# Patient Record
Sex: Male | Born: 1970 | Race: White | Hispanic: No | Marital: Married | Smoking: Never smoker
Health system: Southern US, Community
[De-identification: ages and names within clinical notes are randomized; demographics above are authoritative.]

## PROBLEM LIST (undated history)

## (undated) DIAGNOSIS — K635 Polyp of colon: Secondary | ICD-10-CM

## (undated) HISTORY — PX: NASAL SEPTUM SURGERY: SHX37

## (undated) HISTORY — PX: NASAL SINUS SURGERY: SHX719

## (undated) HISTORY — DX: Polyp of colon: K63.5

---

## 2017-01-03 DIAGNOSIS — D2262 Melanocytic nevi of left upper limb, including shoulder: Secondary | ICD-10-CM | POA: Diagnosis not present

## 2017-01-03 DIAGNOSIS — D225 Melanocytic nevi of trunk: Secondary | ICD-10-CM | POA: Diagnosis not present

## 2017-01-03 DIAGNOSIS — D2271 Melanocytic nevi of right lower limb, including hip: Secondary | ICD-10-CM | POA: Diagnosis not present

## 2017-01-03 DIAGNOSIS — L821 Other seborrheic keratosis: Secondary | ICD-10-CM | POA: Diagnosis not present

## 2017-02-17 ENCOUNTER — Ambulatory Visit: Payer: BLUE CROSS/BLUE SHIELD | Admitting: Podiatry

## 2017-02-17 ENCOUNTER — Encounter: Payer: Self-pay | Admitting: Podiatry

## 2017-02-17 ENCOUNTER — Ambulatory Visit (INDEPENDENT_AMBULATORY_CARE_PROVIDER_SITE_OTHER): Payer: BLUE CROSS/BLUE SHIELD

## 2017-02-17 DIAGNOSIS — M779 Enthesopathy, unspecified: Secondary | ICD-10-CM

## 2017-02-17 MED ORDER — MELOXICAM 15 MG PO TABS: 15.0000 mg | ORAL_TABLET | Freq: Every day | ORAL | 2 refills | Status: DC

## 2017-02-17 NOTE — Patient Instructions (Signed)
Plantar Fasciitis Rehab Ask your health care provider which exercises are safe for you. Do exercises exactly as told by your health care provider and adjust them as directed. It is normal to feel mild stretching, pulling, tightness, or discomfort as you do these exercises, but you should stop right away if you feel sudden pain or your pain gets worse. Do not begin these exercises until told by your health care provider. Stretching and range of motion exercises These exercises warm up your muscles and joints and improve the movement and flexibility of your foot. These exercises also help to relieve pain. Exercise A: Plantar fascia stretch  1. Sit with your left / right leg crossed over your opposite knee. 2. Hold your heel with one hand with that thumb near your arch. With your other hand, hold your toes and gently pull them back toward the top of your foot. You should feel a stretch on the bottom of your toes or your foot or both. 3. Hold this stretch for__________ seconds. 4. Slowly release your toes and return to the starting position. Repeat __________ times. Complete this exercise __________ times a day. Exercise B: Gastroc, standing  1. Stand with your hands against a wall. 2. Extend your left / right leg behind you, and bend your front knee slightly. 3. Keeping your heels on the floor and keeping your back knee straight, shift your weight toward the wall without arching your back. You should feel a gentle stretch in your left / right calf. 4. Hold this position for __________ seconds. Repeat __________ times. Complete this exercise __________ times a day. Exercise C: Soleus, standing 1. Stand with your hands against a wall. 2. Extend your left / right leg behind you, and bend your front knee slightly. 3. Keeping your heels on the floor, bend your back knee and slightly shift your weight over the back leg. You should feel a gentle stretch deep in your calf. 4. Hold this position for  __________ seconds. Repeat __________ times. Complete this exercise __________ times a day. Exercise D: Gastrocsoleus, standing 1. Stand with the ball of your left / right foot on a step. The ball of your foot is on the walking surface, right under your toes. 2. Keep your other foot firmly on the same step. 3. Hold onto the wall or a railing for balance. 4. Slowly lift your other foot, allowing your body weight to press your heel down over the edge of the step. You should feel a stretch in your left / right calf. 5. Hold this position for __________ seconds. 6. Return both feet to the step. 7. Repeat this exercise with a slight bend in your left / right knee. Repeat __________ times with your left / right knee straight and __________ times with your left / right knee bent. Complete this exercise __________ times a day. Balance exercise This exercise builds your balance and strength control of your arch to help take pressure off your plantar fascia. Exercise E: Single leg stand 1. Without shoes, stand near a railing or in a doorway. You may hold onto the railing or door frame as needed. 2. Stand on your left / right foot. Keep your big toe down on the floor and try to keep your arch lifted. Do not let your foot roll inward. 3. Hold this position for __________ seconds. 4. If this exercise is too easy, you can try it with your eyes closed or while standing on a pillow. Repeat __________ times. Complete  this exercise __________ times a day. This information is not intended to replace advice given to you by your health care provider. Make sure you discuss any questions you have with your health care provider. Document Released: 01/10/2005 Document Revised: 09/15/2015 Document Reviewed: 11/24/2014 Elsevier Interactive Patient Education  2018 [employer]   Peroneal Tendinopathy Rehab Ask your health care provider which exercises are safe for you. Do exercises exactly as told by your health care  provider and adjust them as directed. It is normal to feel mild stretching, pulling, tightness, or discomfort as you do these exercises, but you should stop right away if you feel sudden pain or your pain gets worse.Do not begin these exercises until told by your health care provider. Stretching and range of motion exercises These exercises warm up your muscles and joints and improve the movement and flexibility of your ankle. These exercises also help to relieve pain and stiffness. Exercise A: Gastroc and soleus, standing 1. Stand on the edge of a step on the balls of your feet. The ball of your foot is on the walking surface, right under your toes. 2. Hold onto the railing for balance. 3. Slowly lift your left / right foot, allowing your body weight to press your left / right heel down over the edge of the step. You should feel a stretch in your left / right calf. 4. Hold this position for __________ seconds. Repeat __________ times with your left / right knee straight and __________ times with your left / right knee bent. Complete this stretch __________ times per day. Strengthening exercises These exercises improve the strength and endurance of your foot and ankle. Endurance is the ability to use your muscles for a long time, even after they get tired. Exercise B: Dorsiflexors  1. Secure a rubber exercise band or tube to an object, like a table leg, that will not move if it is pulled on. 2. Secure the other end of the band around your left / right foot. 3. Sit on the floor, facing the object with your left / right foot extended. The band or tube should be slightly tense when your foot is relaxed. 4. Slowly flex your left / right ankle and toes to bring your foot toward you. 5. Hold this position for __________ seconds. 6. Slowly return your foot to the starting position. Repeat __________ times. Complete this exercise __________ times per day. Exercise C: Evertors 1. Sit on the floor with  your legs straight out in front of you. 2. Loop a rubber exercise or band or tube around the ball of your left / right foot. The ball of your foot is on the walking surface, right under your toes. 3. Hold the ends of the band in your hands, or secure the band to a stable object. 4. Slowly push your foot outward, away from your other leg. 5. Hold this position for __________ seconds. 6. Slowly return your foot to the starting position. Repeat __________ times. Complete this exercise __________ times per day. Exercise D: Standing heel raise ( plantar flexion) 1. Stand with your feet shoulder-width apart with the balls of your feet on a step. The ball of your foot is on the walking surface, right under your toes. 2. Keep your weight spread evenly over the width of your feet while you rise up on your toes. Use a wall or railing to steady yourself, but try not to use it for support. 3. If this exercise is too easy, try   these options: ? Shift your weight toward your left / right leg until you feel challenged. ? If told by your health care provider, stand on your left / right leg only. 4. Hold this position for __________ seconds. Repeat __________ times. Complete this exercise __________ times per day. Exercise E: Single leg stand 1. Without shoes, stand near a railing or in a doorway. You may hold onto the railing or door frame as needed. 2. Stand on your left / right foot. Keep your big toe down on the floor and try to keep your arch lifted. ? Do not roll to the outside of your foot. ? If this exercise is too easy, you can try it with your eyes closed or while standing on a pillow. 3. Hold this position for __________ seconds. Repeat __________ times. Complete this exercise __________ times per day. This information is not intended to replace advice given to you by your health care provider. Make sure you discuss any questions you have with your health care provider. Document Released: 01/10/2005  Document Revised: 09/17/2015 Document Reviewed: 11/29/2014 Elsevier Interactive Patient Education  [employer]2018 [employer] Inc.

## 2017-02-20 NOTE — Progress Notes (Signed)
Subjective:   Patient ID: Jesus Jimenez, male   DOB: 47 y.o.   MRN: [id number]   HPI Jesus Jimenez presents the office today for concerns of right foot pain.  He states that he runs 5K's on the weekends and also he has been doing some longer races.  Last weekend he started noticing increased sharp pain to his right foot which was new and he points on the heel as well as the outside aspect of the foot.  He states that the area was painful for couple days and has resolved he has no pain today.  He did have some mild swelling last week and that the swelling is also improved.  Denies any redness or warmth.  No specific injury that he can recall.  He does have a history of fracture several years ago.  He has no other concerns.  Last weekend when the pain started he did run in an older pair of shoes.  Review of Systems  All other systems reviewed and are negative.  No past medical history on file.     Current Outpatient Medications:  .  meloxicam (MOBIC) 15 MG tablet, Take 1 tablet (15 mg total) by mouth daily., Disp: 30 tablet, Rfl: 2  Not on File  Social History   Socioeconomic History  . Marital status: Married    Spouse name: Not on file  . Number of children: Not on file  . Years of education: Not on file  . Highest education level: Not on file  Social Needs  . Financial resource strain: Not on file  . Food insecurity - worry: Not on file  . Food insecurity - inability: Not on file  . Transportation needs - medical: Not on file  . Transportation needs - non-medical: Not on file  Occupational History  . Not on file  Tobacco Use  . Smoking status: Never Smoker  . Smokeless tobacco: Never Used  Substance and Sexual Activity  . Alcohol use: Not on file  . Drug use: Not on file  . Sexual activity: Not on file  Other Topics Concern  . Not on file  Social History Narrative  . Not on file         Objective:  Physical Exam  General: AAO x3, NAD  Dermatological: Skin is  warm, dry and supple bilateral. Nails x 10 are well manicured; remaining integument appears unremarkable at this time. There are no open sores, no preulcerative lesions, no rash or signs of infection present.  Vascular: Dorsalis Pedis artery and Posterior Tibial artery pedal pulses are 2/4 bilateral with immedate capillary fill time.  There is no pain with calf compression, swelling, warmth, erythema.   Neruologic: Grossly intact via light touch bilateral.  Protective threshold with Semmes Wienstein monofilament intact to all pedal sites bilateral.   Musculoskeletal: There is a dorsal prominence of the second metatarsal.  There is no area pinpoint bony tenderness identified today there is no area of pain with vibratory sensation.  There is no edema.  There is subjective tenderness along the lateral aspect of the foot on the course of the peroneal tendon along the insertion of the fifth metatarsal base but there is no pain to the area today.  Also he was having some pain in the bottom of his heel and this was on the plantar aspect of the heel but again there is no pain today.  Plantar fascia appears to be intact and Achilles tendon appears to be intact.  Overall rectus  foot type with weightbearing.  Muscular strength 5/5 in all groups tested bilateral.  Gait: Unassisted, Nonantalgic.      Assessment:  Right foot tendinitis stress fracture unlikely given no pain today    Plan:  -Treatment options discussed including all alternatives, risks, and complications -Etiology of symptoms were discussed -X-rays were obtained and reviewed with the patient.  No definitive evidence of acute fracture.  Old fracture of the second metatarsal.  No evidence of stress fracture today.  Small calcaneal spurring is present. -At this time there is no pain on exam.  Because of this I do not think that he has a stress fracture.  This is more a result of tendinitis.  We discussed stretching, rehab exercises.  Also prescribed  meloxicam to take as needed.  Ice to the area.  Recommended him to not wear the shoes that he wore last weekend as they were old.  If he gets recurrent symptoms we will need to get a new x-ray.  Also discussed possibly inserts in his shoes.  Also discussed physical therapy.  Jesus Jimenez DPM

## 2017-05-16 DIAGNOSIS — L821 Other seborrheic keratosis: Secondary | ICD-10-CM | POA: Diagnosis not present

## 2017-05-16 DIAGNOSIS — S40861A Insect bite (nonvenomous) of right upper arm, initial encounter: Secondary | ICD-10-CM | POA: Diagnosis not present

## 2017-05-16 DIAGNOSIS — C44612 Basal cell carcinoma of skin of right upper limb, including shoulder: Secondary | ICD-10-CM | POA: Diagnosis not present

## 2017-05-23 ENCOUNTER — Ambulatory Visit (INDEPENDENT_AMBULATORY_CARE_PROVIDER_SITE_OTHER): Payer: BLUE CROSS/BLUE SHIELD | Admitting: Family Medicine

## 2017-05-23 ENCOUNTER — Encounter: Payer: Self-pay | Admitting: Family Medicine

## 2017-05-23 VITALS — BP 122/80 | HR 81 | Temp 97.7°F | Resp 14 | Ht 71.0 in | Wt 177.0 lb

## 2017-05-23 DIAGNOSIS — K635 Polyp of colon: Secondary | ICD-10-CM | POA: Insufficient documentation

## 2017-05-23 DIAGNOSIS — Z13220 Encounter for screening for lipoid disorders: Secondary | ICD-10-CM

## 2017-05-23 DIAGNOSIS — Z114 Encounter for screening for human immunodeficiency virus [HIV]: Secondary | ICD-10-CM

## 2017-05-23 DIAGNOSIS — G4730 Sleep apnea, unspecified: Secondary | ICD-10-CM | POA: Diagnosis not present

## 2017-05-23 DIAGNOSIS — Z0001 Encounter for general adult medical examination with abnormal findings: Secondary | ICD-10-CM

## 2017-05-23 DIAGNOSIS — R1900 Intra-abdominal and pelvic swelling, mass and lump, unspecified site: Secondary | ICD-10-CM | POA: Insufficient documentation

## 2017-05-23 LAB — CBC
HCT: 44.6 % (ref 39.0–52.0)
Hemoglobin: 15.2 g/dL (ref 13.0–17.0)
MCHC: 34.1 g/dL (ref 30.0–36.0)
MCV: 92.4 fl (ref 78.0–100.0)
PLATELETS: 349 10*3/uL (ref 150.0–400.0)
RBC: 4.83 Mil/uL (ref 4.22–5.81)
RDW: 13.1 % (ref 11.5–15.5)
WBC: 5.1 10*3/uL (ref 4.0–10.5)

## 2017-05-23 LAB — LIPID PANEL
Cholesterol: 144 mg/dL (ref 0–200)
HDL: 44.4 mg/dL (ref >39.00)
LDL CALC: 88 mg/dL (ref 0–99)
NONHDL: 99.3
Total CHOL/HDL Ratio: 3
Triglycerides: 57 mg/dL (ref 0.0–149.0)
VLDL: 11.4 mg/dL (ref 0.0–40.0)

## 2017-05-23 LAB — COMPREHENSIVE METABOLIC PANEL
ALBUMIN: 4.5 g/dL (ref 3.5–5.2)
ALT: 24 U/L (ref 0–53)
AST: 17 U/L (ref 0–37)
Alkaline Phosphatase: 46 U/L (ref 39–117)
BUN: 11 mg/dL (ref 6–23)
CHLORIDE: 106 meq/L (ref 96–112)
CO2: 27 mEq/L (ref 19–32)
CREATININE: 0.93 mg/dL (ref 0.40–1.50)
Calcium: 9.6 mg/dL (ref 8.4–10.5)
GFR: 92.93 mL/min (ref >60.00)
Glucose, Bld: 83 mg/dL (ref 70–99)
Potassium: 4.5 mEq/L (ref 3.5–5.1)
Sodium: 142 mEq/L (ref 135–145)
Total Bilirubin: 0.7 mg/dL (ref 0.2–1.2)
Total Protein: 7.2 g/dL (ref 6.0–8.3)

## 2017-05-23 LAB — TSH: TSH: 1.96 u[IU]/mL (ref 0.35–4.50)

## 2017-05-23 NOTE — Assessment & Plan Note (Signed)
With occasional dozing off during the day.  Will place referral for sleep study.  Also check CBC, CMET, and TSH.

## 2017-05-23 NOTE — Patient Instructions (Signed)
It would be a good idea for you to get a sleep study. We can set this up if you are interested.  I think the spot on your abdomen is a benign lipoma. We can get an ultrasound to verify this.   Preventive Care 40-64 Years, Male Preventive care refers to lifestyle choices and visits with your health care provider that can promote health and wellness. What does preventive care include?  A yearly physical exam. This is also called an annual well check.  Dental exams once or twice a year.  Routine eye exams. Ask your health care provider how often you should have your eyes checked.  Personal lifestyle choices, including: ? Daily care of your teeth and gums. ? Regular physical activity. ? Eating a healthy diet. ? Avoiding tobacco and drug use. ? Limiting alcohol use. ? Practicing safe sex. ? Taking low-dose aspirin every day starting at age 38. What happens during an annual well check? The services and screenings done by your health care provider during your annual well check will depend on your age, overall health, lifestyle risk factors, and family history of disease. Counseling Your health care provider may ask you questions about your:  Alcohol use.  Tobacco use.  Drug use.  Emotional well-being.  Home and relationship well-being.  Sexual activity.  Eating habits.  Work and work Statistician.  Screening You may have the following tests or measurements:  Height, weight, and BMI.  Blood pressure.  Lipid and cholesterol levels. These may be checked every 5 years, or more frequently if you are over 64 years old.  Skin check.  Lung cancer screening. You may have this screening every year starting at age 48 if you have a 30-pack-year history of smoking and currently smoke or have quit within the past 15 years.  Fecal occult blood test (FOBT) of the stool. You may have this test every year starting at age 40.  Flexible sigmoidoscopy or colonoscopy. You may have a  sigmoidoscopy every 5 years or a colonoscopy every 10 years starting at age 17.  Prostate cancer screening. Recommendations will vary depending on your family history and other risks.  Hepatitis C blood test.  Hepatitis B blood test.  Sexually transmitted disease (STD) testing.  Diabetes screening. This is done by checking your blood sugar (glucose) after you have not eaten for a while (fasting). You may have this done every 1-3 years.  Discuss your test results, treatment options, and if necessary, the need for more tests with your health care provider. Vaccines Your health care provider may recommend certain vaccines, such as:  Influenza vaccine. This is recommended every year.  Tetanus, diphtheria, and acellular pertussis (Tdap, Td) vaccine. You may need a Td booster every 10 years.  Varicella vaccine. You may need this if you have not been vaccinated.  Zoster vaccine. You may need this after age 82.  Measles, mumps, and rubella (MMR) vaccine. You may need at least one dose of MMR if you were born in 1957 or later. You may also need a second dose.  Pneumococcal 13-valent conjugate (PCV13) vaccine. You may need this if you have certain conditions and have not been vaccinated.  Pneumococcal polysaccharide (PPSV23) vaccine. You may need one or two doses if you smoke cigarettes or if you have certain conditions.  Meningococcal vaccine. You may need this if you have certain conditions.  Hepatitis A vaccine. You may need this if you have certain conditions or if you travel or work in places  where you may be exposed to hepatitis A.  Hepatitis B vaccine. You may need this if you have certain conditions or if you travel or work in places where you may be exposed to hepatitis B.  Haemophilus influenzae type b (Hib) vaccine. You may need this if you have certain risk factors.  Talk to your health care provider about which screenings and vaccines you need and how often you need  them. This information is not intended to replace advice given to you by your health care provider. Make sure you discuss any questions you have with your health care provider. Document Released: 02/06/2015 Document Revised: 09/30/2015 Document Reviewed: 11/11/2014 Elsevier Interactive Patient Education  [employer].

## 2017-05-23 NOTE — Progress Notes (Signed)
Subjective:  Jesus Jimenez is a 47 y.o. male who presents today for his annual comprehensive physical exam.    HPI:  He has no acute complaints today.   He has a few stable, chronic conditions outlined below:  1.  History of colon polyps.  Diagnosed in his 51s.  Currently gets colonoscopy every 5 years.  Last done a couple of years ago. 2.  Sleep disordered breathing.  Patient underwent sleep study 5 or 6 years ago and was diagnosed with mild case of sleep apnea.  Tried CPAP for a period of time however felt that he did not need it and only agrees it.  Currently snores quite a bit.  No nocturnal apneic episodes.  Will occasionally doze off during the day after eating a large meal. 3.  Abdominal mass.  Noticed a couple of months ago.  Has been stable over that time.  Located in his right mid abdomen.  No pain.  No obvious precipitating events.  Lifestyle Diet: No specific diets.  Exercise: Tries to run once weekly.  Depression screen PHQ 2/9 05/23/2017  Decreased Interest 0  Down, Depressed, Hopeless 0  PHQ - 2 Score 0    Health Maintenance Due  Topic Date Due  . HIV Screening  04/22/1986  . TETANUS/TDAP  04/22/1990     ROS: Per HPI, otherwise a complete review of systems was negative.   PMH:  The following were reviewed and entered/updated in epic: Past Medical History:  Diagnosis Date  . Colon polyps    Patient Active Problem List   Diagnosis Date Noted  . Sleep-disordered breathing 05/23/2017  . Polyp of colon 05/23/2017  . Abdominal mass 05/23/2017   Past Surgical History:  Procedure Laterality Date  . NASAL SEPTUM SURGERY    . NASAL SINUS SURGERY      Family History  Problem Relation Age of Onset  . Prostate cancer Father        mid 70s  . Skin cancer Brother     Medications- reviewed and updated No current outpatient medications on file.   No current facility-administered medications for this visit.     Allergies-reviewed and updated No Known  Allergies  Social History   Socioeconomic History  . Marital status: Married    Spouse name: Not on file  . Number of children: 4  . Years of education: Not on file  . Highest education level: Not on file  Occupational History  . Not on file  Social Needs  . Financial resource strain: Not on file  . Food insecurity:    Worry: Not on file    Inability: Not on file  . Transportation needs:    Medical: Not on file    Non-medical: Not on file  Tobacco Use  . Smoking status: Never Smoker  . Smokeless tobacco: Never Used  Substance and Sexual Activity  . Alcohol use: Never    Frequency: Never  . Drug use: Never  . Sexual activity: Not on file  Lifestyle  . Physical activity:    Days per week: Not on file    Minutes per session: Not on file  . Stress: Not on file  Relationships  . Social connections:    Talks on phone: Not on file    Gets together: Not on file    Attends religious service: Not on file    Active member of club or organization: Not on file    Attends meetings of clubs or organizations: Not on  file    Relationship status: Not on file  Other Topics Concern  . Not on file  Social History Narrative  . Not on file    Objective:  Physical Exam: BP 122/80 (BP Location: Left Arm)   Pulse 81   Temp 97.7 F (36.5 C)   Resp 14   Ht  (1.803 m)   Wt 177 lb (80.3 kg)   SpO2 95%   BMI 24.69 kg/m   Body mass index is 24.69 kg/m. Wt Readings from Last 3 Encounters:  05/23/17 177 lb (80.3 kg)   Gen: NAD, resting comfortably HEENT: TMs normal bilaterally. OP clear. No thyromegaly noted.  CV: RRR with no murmurs appreciated Pulm: NWOB, CTAB with no crackles, wheezes, or rhonchi GI: Normal bowel sounds present. Soft, Nontender, Nondistended.  Approximately 1 to 2 cm freely mobile mass on right abdominal wall.  Nontender to palpation. MSK: no edema, cyanosis, or clubbing noted Skin: warm, dry Neuro: CN2-12 grossly intact. Strength 5/5 in upper and lower  extremities. Reflexes symmetric and intact bilaterally.  Psych: Normal affect and thought content  Assessment/Plan:  Sleep-disordered breathing With occasional dozing off during the day.  Will place referral for sleep study.  Also check CBC, CMET, and TSH.  Polyp of colon Obtain records from previous PCP.  Will need referral to GI when he is due for his next colonoscopy.  Abdominal mass Likely lipoma.  Check ultrasound to rule out other etiologies.  Preventative Healthcare: Check lipid panel.  Check HIV antibody.  Patient Counseling(The following topics were reviewed and/or handout was given):  -Nutrition: Stressed importance of moderation in sodium/caffeine intake, saturated fat and cholesterol, caloric balance, sufficient intake of fresh fruits, vegetables, and fiber.  -Stressed the importance of regular exercise.   -Substance Abuse: Discussed cessation/primary prevention of tobacco, alcohol, or other drug use; driving or other dangerous activities under the influence; availability of treatment for abuse.   -Injury prevention: Discussed safety belts, safety helmets, smoke detector, smoking near bedding or upholstery.   -Sexuality: Discussed sexually transmitted diseases, partner selection, use of condoms, avoidance of unintended pregnancy and contraceptive alternatives.   -Dental health: Discussed importance of regular tooth brushing, flossing, and dental visits.  -Health maintenance and immunizations reviewed. Please refer to Health maintenance section.  Return to care in 1 year for next preventative visit.   Katina Degree. Jimmey Ralph, MD 05/23/2017 10:03 AM

## 2017-05-23 NOTE — Assessment & Plan Note (Signed)
Likely lipoma.  Check ultrasound to rule out other etiologies.

## 2017-05-23 NOTE — Assessment & Plan Note (Signed)
Obtain records from previous PCP.  Will need referral to GI when he is due for his next colonoscopy.

## 2017-05-24 LAB — HIV ANTIBODY (ROUTINE TESTING W REFLEX): HIV 1&2 Ab, 4th Generation: NONREACTIVE

## 2017-06-06 ENCOUNTER — Ambulatory Visit: Payer: Self-pay | Admitting: Nurse Practitioner

## 2017-06-07 ENCOUNTER — Ambulatory Visit
Admission: RE | Admit: 2017-06-07 | Discharge: 2017-06-07 | Disposition: A | Discharge status: Home / Self Care | Payer: BLUE CROSS/BLUE SHIELD | Source: Ambulatory Visit | Attending: Family Medicine | Admitting: Family Medicine

## 2017-06-07 DIAGNOSIS — R1900 Intra-abdominal and pelvic swelling, mass and lump, unspecified site: Secondary | ICD-10-CM | POA: Diagnosis not present

## 2017-06-26 ENCOUNTER — Encounter: Payer: Self-pay | Admitting: Neurology

## 2017-07-17 ENCOUNTER — Institutional Professional Consult (permissible substitution): Payer: BLUE CROSS/BLUE SHIELD | Admitting: Neurology

## 2017-09-04 DIAGNOSIS — D225 Melanocytic nevi of trunk: Secondary | ICD-10-CM | POA: Diagnosis not present

## 2017-09-04 DIAGNOSIS — D229 Melanocytic nevi, unspecified: Secondary | ICD-10-CM | POA: Diagnosis not present

## 2017-09-04 DIAGNOSIS — L814 Other melanin hyperpigmentation: Secondary | ICD-10-CM | POA: Diagnosis not present

## 2017-09-04 DIAGNOSIS — D1801 Hemangioma of skin and subcutaneous tissue: Secondary | ICD-10-CM | POA: Diagnosis not present

## 2017-09-06 DIAGNOSIS — R05 Cough: Secondary | ICD-10-CM | POA: Diagnosis not present

## 2017-09-06 DIAGNOSIS — J310 Chronic rhinitis: Secondary | ICD-10-CM | POA: Diagnosis not present

## 2017-11-10 ENCOUNTER — Encounter: Payer: Self-pay | Admitting: Family Medicine

## 2017-11-10 ENCOUNTER — Ambulatory Visit: Payer: BLUE CROSS/BLUE SHIELD | Admitting: Family Medicine

## 2017-11-10 DIAGNOSIS — R1900 Intra-abdominal and pelvic swelling, mass and lump, unspecified site: Secondary | ICD-10-CM

## 2017-11-10 NOTE — Assessment & Plan Note (Addendum)
No red flag signs or symptoms.  His ultrasound was negative. Reassured patient.  Discussed likely benign nature of the lump and the utility of abdominal CT.  Patient declined for the time being.  We will continue with watchful waiting.

## 2017-11-10 NOTE — Progress Notes (Signed)
   Subjective:  Jesus Jimenez is a 46 y.o. male who presents today with a chief complaint of abdominal lump.   HPI:  Abdominal Lump, established problem Seen 6 months ago for this.  Had an ultrasound done which was negative.  Has since had a recurrence of the abdominal lump.  It is nonpainful.  Does not change in size.  Located on the right side of his abdomen.   ROS: Per HPI  Objective:  Physical Exam: BP 120/70 (BP Location: Right Arm, Patient Position: Sitting, Cuff Size: Normal)   Pulse 71   Temp 98.6 F (37 C) (Oral)   Wt 176 lb 3.2 oz (79.9 kg)   SpO2 100%   BMI 24.57 kg/m   Gen: NAD, resting comfortably GI: very subtle 1 to 2 cm mass on right abdominal wall.  Nontender to palpation.  Assessment/Plan:  Abdominal mass No red flag signs or symptoms.  His ultrasound was negative. Reassured patient.  Discussed likely benign nature of the lump and the utility of abdominal CT.  Patient declined for the time being.  We will continue with watchful waiting.  Time Spent: I spent 15 minutes face-to-face with the patient, with more than half spent on counseling for management plan for his abdominal mass.  Katina Degree. Jimmey Ralph, MD 11/10/2017 8:23 AM

## 2017-11-13 ENCOUNTER — Telehealth: Payer: Self-pay | Admitting: Family Medicine

## 2017-11-13 DIAGNOSIS — R1900 Intra-abdominal and pelvic swelling, mass and lump, unspecified site: Secondary | ICD-10-CM

## 2017-11-13 NOTE — Telephone Encounter (Signed)
Order has been placed.  Katina Degree. Jimmey Ralph, MD 11/13/2017 2:05 PM

## 2017-11-13 NOTE — Telephone Encounter (Signed)
Copied from CRM [id number]. Topic: Quick Communication - See Telephone Encounter >> Nov 13, 2017 11:45 AM Herby Abraham C wrote: CRM for notification. See Telephone encounter for: 11/13/17.  Pt's spouse called in to make PCP aware that they would like to go ahead with the CAT Scan per there conversation at last ov.   Please assist

## 2017-11-13 NOTE — Telephone Encounter (Signed)
See note

## 2017-11-25 ENCOUNTER — Ambulatory Visit
Admission: RE | Admit: 2017-11-25 | Discharge: 2017-11-25 | Disposition: A | Discharge status: Home / Self Care | Payer: BLUE CROSS/BLUE SHIELD | Source: Ambulatory Visit | Attending: Family Medicine | Admitting: Family Medicine

## 2017-11-25 DIAGNOSIS — R1900 Intra-abdominal and pelvic swelling, mass and lump, unspecified site: Secondary | ICD-10-CM

## 2017-11-25 MED ORDER — IOPAMIDOL (ISOVUE-300) INJECTION 61%
100.0000 mL | Freq: Once | INTRAVENOUS | Status: AC | PRN
  Administered 2017-11-25: 100 mL via INTRAVENOUS

## 2017-11-28 NOTE — Progress Notes (Signed)
Please inform patient of the following:  There were no obvious abnormalities on his CT that would explain his symptoms. They did incidentally find a small collection of blood vessels in his liver as well as a small cyst. These are both benign findings and we do not need to do any further testing. His prostate was mildly enlarged as well. Again this is a benign finding that is unrelated to his symptoms.  Would recommend against further testing at this point. HE should let us know if he starts having any urinary issues, early satiety, abdominal pain, nausea, vomiting, or any other change in symptoms.  Katina Degree. Jimmey Ralph, MD 11/28/2017 12:03 PM

## 2018-09-28 IMAGING — US US ABDOMEN LIMITED
1 series · 6 of 6 positions shown · non-contrast
Comparison: None.

CLINICAL DATA: Palpable superficial right mid abdominal mass first
noticed 2 months ago. No known injury. No significant change since
discovery.

EXAM:
ULTRASOUND ABDOMEN LIMITED

[Series 1: us abdomen limited · 0.05mm/px · 6 acquisitions, 6 frames shown]
[im 1/6]
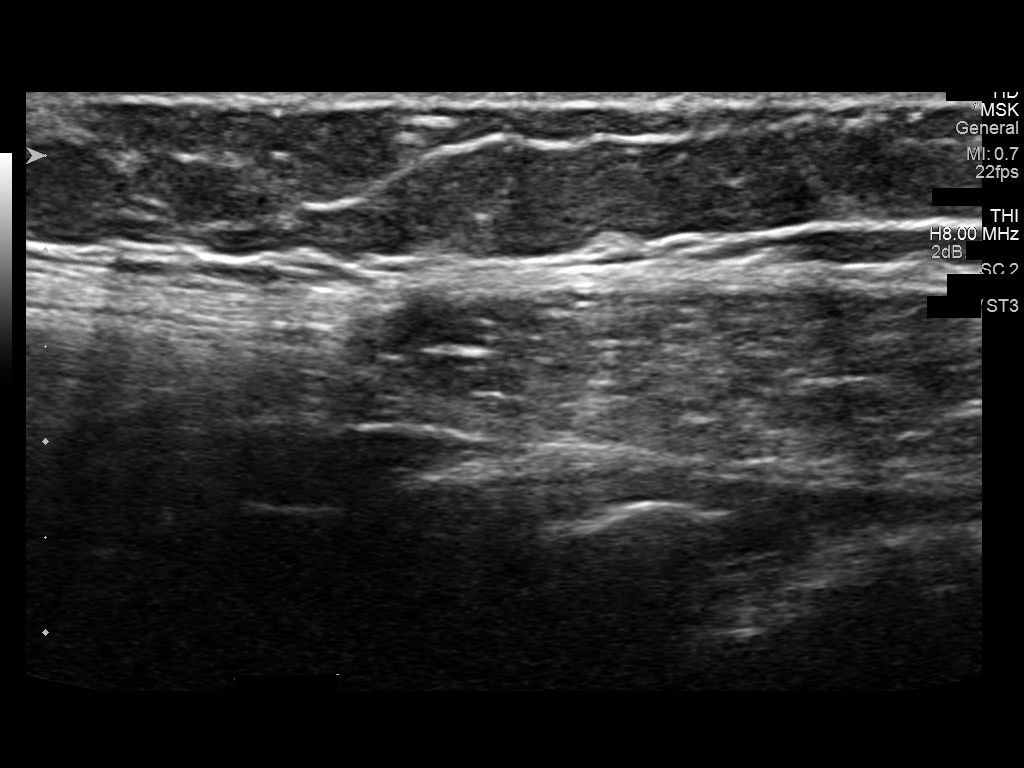
[im 2/6]
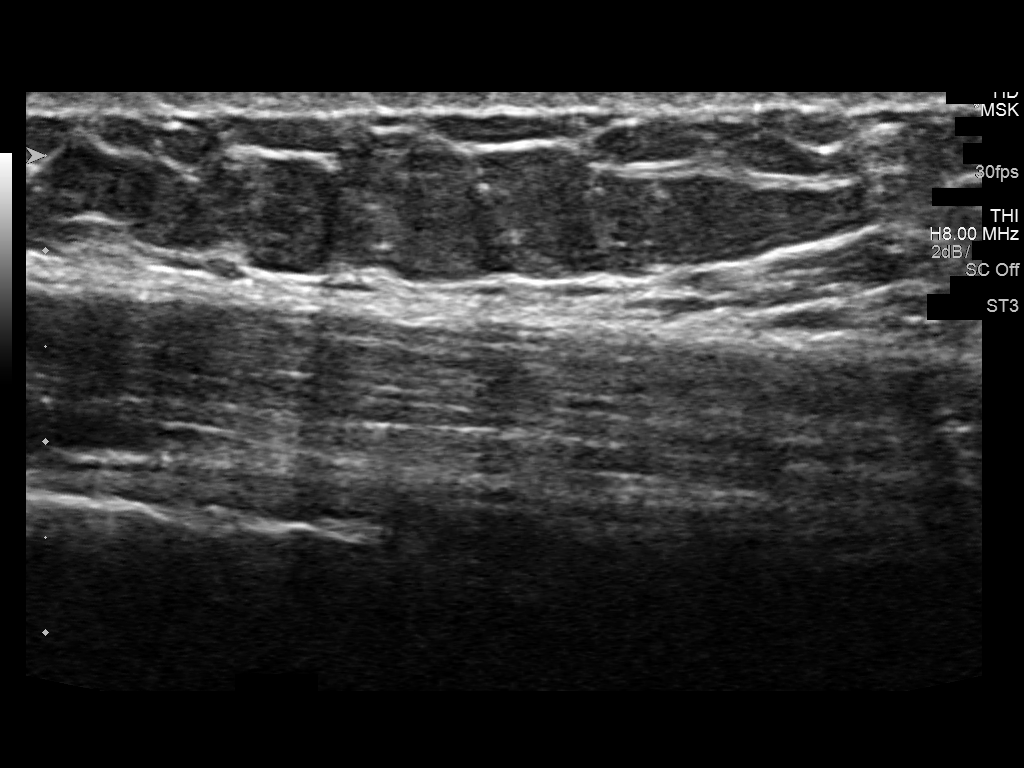
[im 3/6]
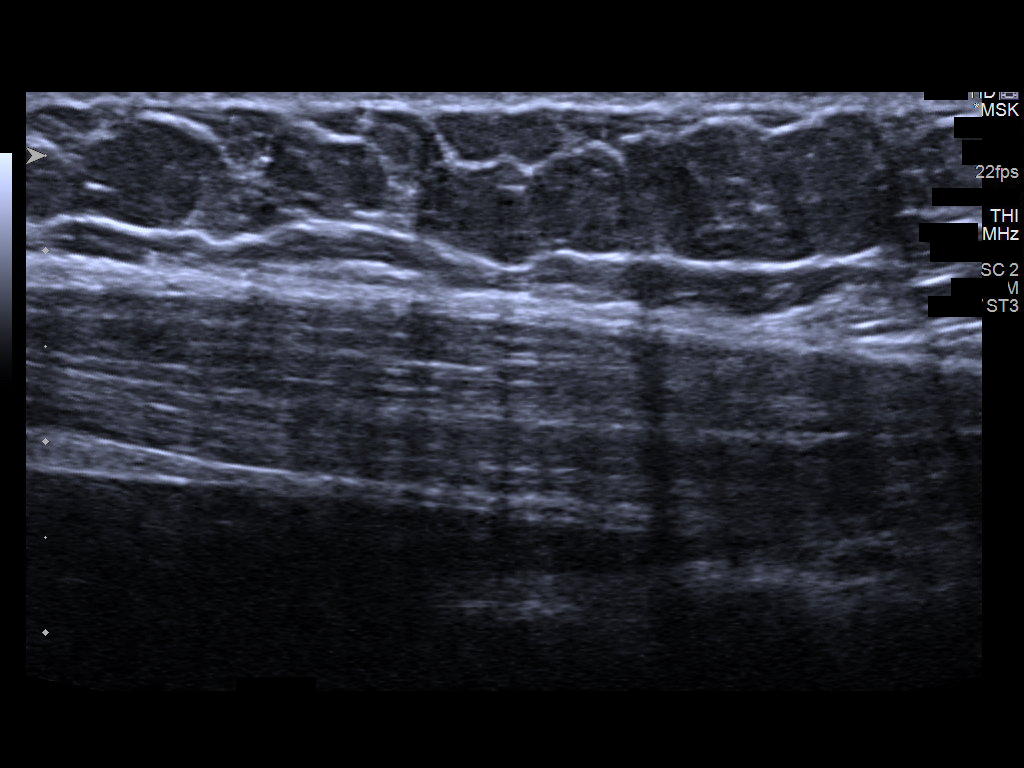
[im 4/6]
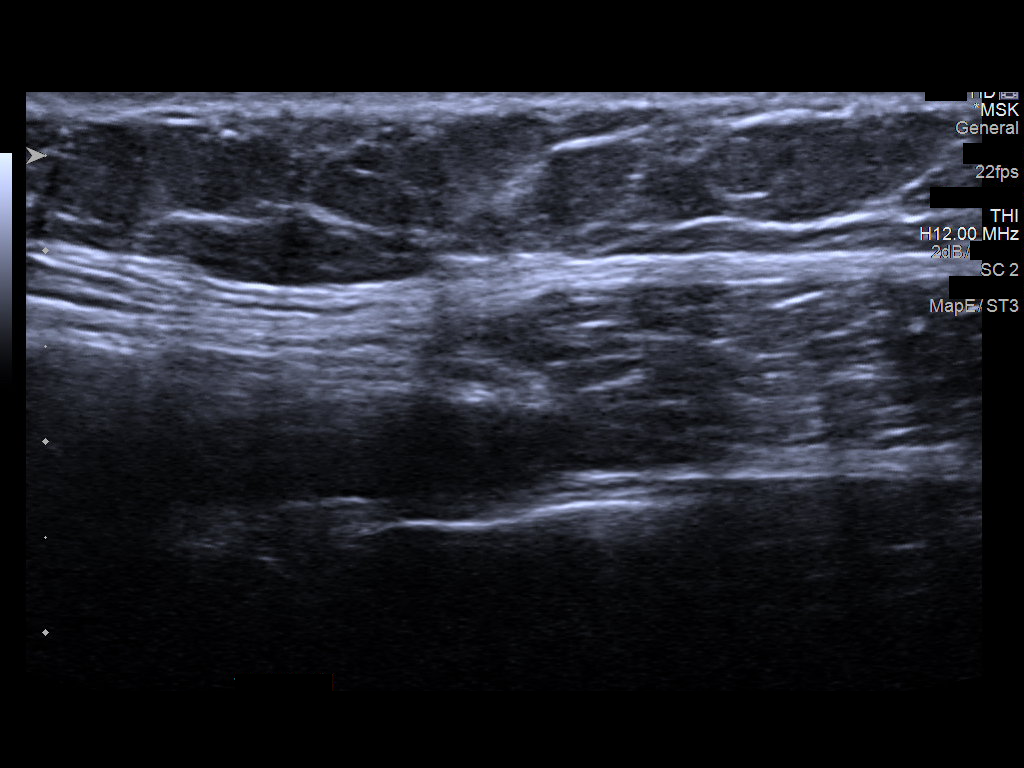
[im 5/6]
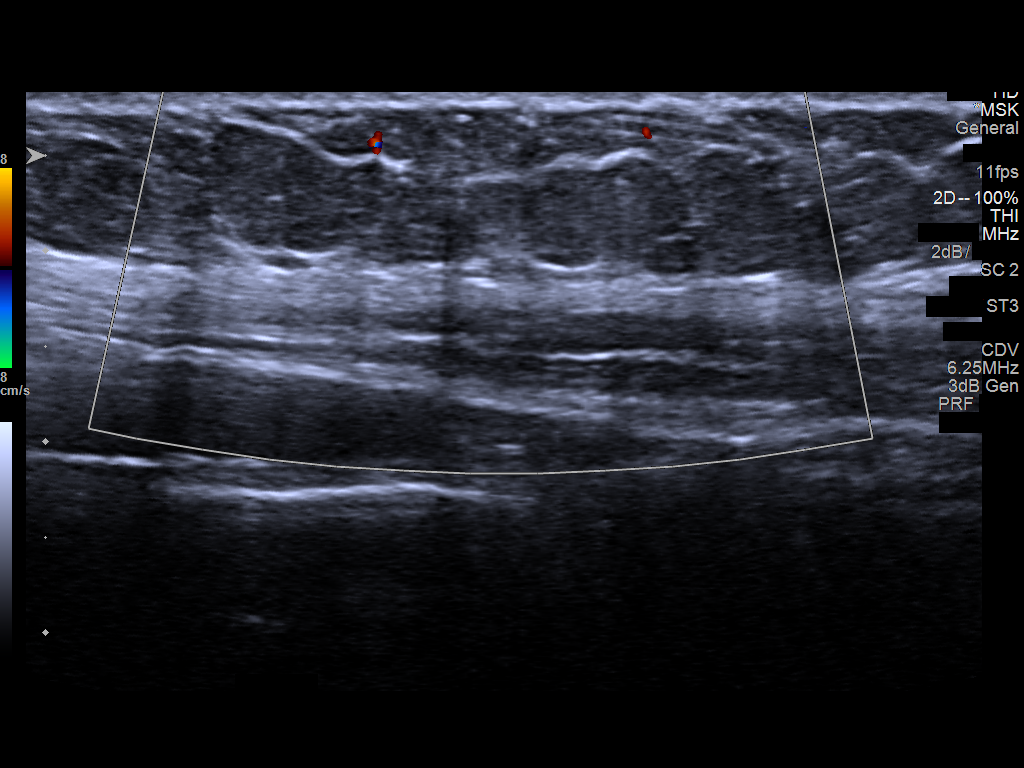
[im 6/6]
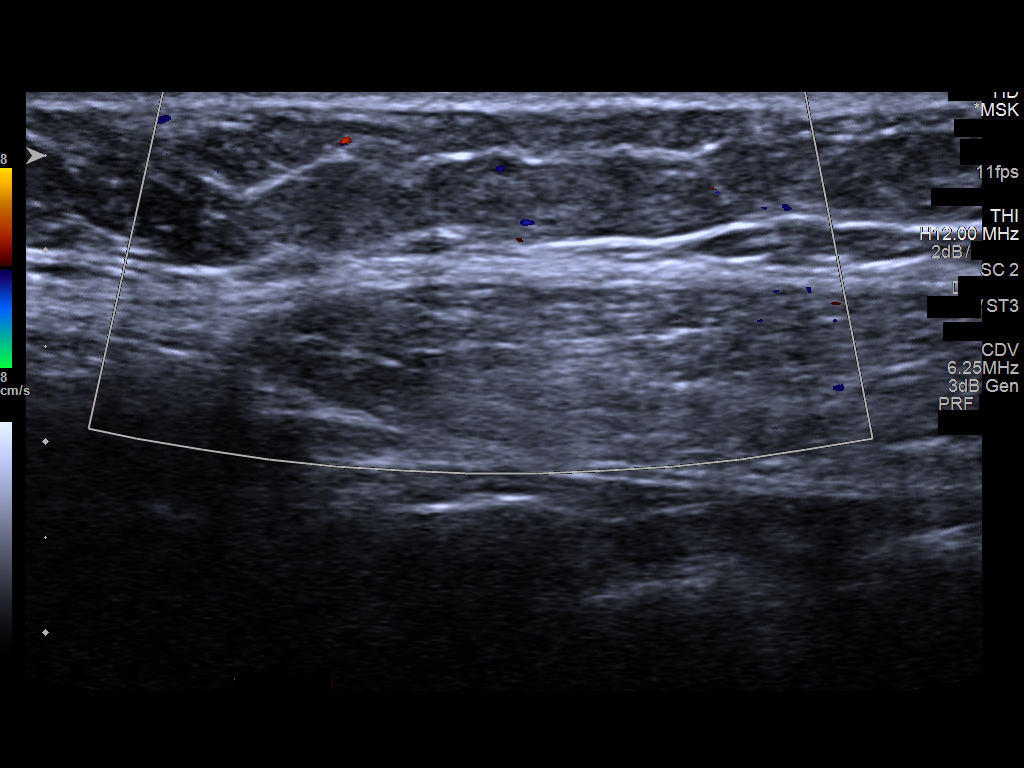

[6 of 6 positions shown; findings below may reference images not displayed]

FINDINGS: The area of clinical concern in the mid abdominal region lateral to
the umbilicus was interrogated with ultrasound. No cystic or solid
mass was observed. No abnormal vascularity was demonstrated.
IMPRESSION: No evidence of a subcutaneous mass or hernia. No ultrasonic
abnormality is observed.

## 2018-12-01 DIAGNOSIS — Z20828 Contact with and (suspected) exposure to other viral communicable diseases: Secondary | ICD-10-CM | POA: Diagnosis not present

## 2018-12-14 ENCOUNTER — Other Ambulatory Visit: Payer: Self-pay

## 2018-12-14 ENCOUNTER — Encounter (HOSPITAL_COMMUNITY): Payer: Self-pay | Admitting: Emergency Medicine

## 2018-12-14 ENCOUNTER — Emergency Department (HOSPITAL_COMMUNITY): Payer: BC Managed Care – PPO

## 2018-12-14 ENCOUNTER — Emergency Department (HOSPITAL_COMMUNITY)
Admission: EM | Admit: 2018-12-14 | Discharge: 2018-12-14 | Disposition: A | Discharge status: Home / Self Care | Payer: BC Managed Care – PPO | Attending: Emergency Medicine | Admitting: Emergency Medicine

## 2018-12-14 DIAGNOSIS — R079 Chest pain, unspecified: Secondary | ICD-10-CM

## 2018-12-14 DIAGNOSIS — R0789 Other chest pain: Secondary | ICD-10-CM | POA: Diagnosis not present

## 2018-12-14 DIAGNOSIS — R110 Nausea: Secondary | ICD-10-CM | POA: Diagnosis not present

## 2018-12-14 DIAGNOSIS — R0602 Shortness of breath: Secondary | ICD-10-CM | POA: Diagnosis not present

## 2018-12-14 LAB — CBC
HCT: 48.8 % (ref 39.0–52.0)
Hemoglobin: 15.8 g/dL (ref 13.0–17.0)
MCH: 30.5 pg (ref 26.0–34.0)
MCHC: 32.4 g/dL (ref 30.0–36.0)
MCV: 94.2 fL (ref 80.0–100.0)
Platelets: 375 10*3/uL (ref 150–400)
RBC: 5.18 MIL/uL (ref 4.22–5.81)
RDW: 12.3 % (ref 11.5–15.5)
WBC: 8.1 10*3/uL (ref 4.0–10.5)
nRBC: 0 % (ref 0.0–0.2)

## 2018-12-14 LAB — BASIC METABOLIC PANEL
Anion gap: 12 (ref 5–15)
BUN: 14 mg/dL (ref 6–20)
CO2: 24 mmol/L (ref 22–32)
Calcium: 9.7 mg/dL (ref 8.9–10.3)
Chloride: 106 mmol/L (ref 98–111)
Creatinine, Ser: 0.94 mg/dL (ref 0.61–1.24)
GFR calc Af Amer: >60 mL/min (ref >60)
GFR calc non Af Amer: >60 mL/min (ref >60)
Glucose, Bld: 77 mg/dL (ref 70–99)
Potassium: 4 mmol/L (ref 3.5–5.1)
Sodium: 142 mmol/L (ref 135–145)

## 2018-12-14 LAB — TROPONIN I (HIGH SENSITIVITY): Troponin I (High Sensitivity): 3 ng/L (ref <18)

## 2018-12-14 MED ORDER — SODIUM CHLORIDE 0.9% FLUSH
3.0000 mL | Freq: Once | INTRAVENOUS | Status: AC
  Administered 2018-12-14: 3 mL via INTRAVENOUS

## 2018-12-14 NOTE — Discharge Instructions (Signed)
Your caregiver has diagnosed you as having chest pain that is not specific for one problem, but does not require admission.  You are at low risk for an acute heart condition or other serious illness. Chest pain comes from many different causes.   It is very important that you speak to your provider in 1-2 days for this visit.  Your doctor may want you to be seen in the office for a follow up visit.  SEEK IMMEDIATE MEDICAL ATTENTION IF: You have severe chest pain, especially if the pain is crushing or pressure-like and spreads to the arms, back, neck, or jaw, or if you have sweating, nausea (feeling sick to your stomach), or shortness of breath. THIS IS AN EMERGENCY. Don't wait to see if the pain will go away. Get medical help at once. Call 911 or 0 (operator). DO NOT drive yourself to the hospital.   Your chest pain gets worse and does not go away with rest.  You have an attack of chest pain lasting longer than usual, despite rest and treatment with the medications your caregiver has prescribed.  You wake from sleep with chest pain or shortness of breath.  You feel dizzy or faint.  You have chest pain not typical of your usual pain for which you originally saw your caregiver.  

## 2018-12-14 NOTE — ED Triage Notes (Signed)
Pt c/o L sided chest tightness, onset last night @ 0300, pt reports pain and shob woke him from sleep, L sided, nonradiating. Denies nausea, diaphoresis.  Pt reports tightness intermittent throughout the day.

## 2018-12-14 NOTE — ED Provider Notes (Signed)
[facility] EMERGENCY DEPARTMENT Provider Note   CSN: [id number] Arrival date & time: 12/14/18  1916     History   Chief Complaint Chief Complaint  Patient presents with  . Chest Pain    HPI Jesus Jimenez is a 48 y.o. male presenting to emergency department with chest pain.  He reports an episode of epigastric and substernal chest pain that began yesterday evening around midnight, and woke him up from sleep.  He took a hot shower and walked around for a bit and states his symptoms subsided after approximately 2 hours, and is able to lay back down to go to bed.  However when he woke up this morning the pain had resumed at a lower intensity.  He has had this discomfort for most of the day although it has been diminishing.  It is currently low intensity.  He describes a general "discomfort" in his epigastrium does not radiate anywhere.  He denies ever having these kind of symptoms before.  He states he has had heartburn and reflux in the past but feels like the symptoms are different.  He cannot clarify for me exactly what he ate last night or how soon he ate before bedtime.  He denies any history of angina.  He denies any smoking history.  He denies any family history of MI or cardiac disease or sudden death.  He reports a history of hypertension.  He denies any history of high cholesterol or diabetes, but states he has not seen a primary care physician in approximately 3 years.  No hemoptysis or asymmetric LE edema. Patient denies personal or family history of DVT or PE. No recent hormone use (including OCP); travel for >6 hours; prolonged immobilization for greater than 3 days; surgeries or trauma in the last 4 weeks; or malignancy with treatment within 6 months.  NKDA Meds: None     HPI  Past Medical History:  Diagnosis Date  . Colon polyps     Patient Active Problem List   Diagnosis Date Noted  . Sleep-disordered breathing 05/23/2017  . Polyp of colon  05/23/2017  . Abdominal mass 05/23/2017    Past Surgical History:  Procedure Laterality Date  . NASAL SEPTUM SURGERY    . NASAL SINUS SURGERY        Home Medications    Prior to Admission medications   Not on File    Family History Family History  Problem Relation Age of Onset  . Prostate cancer Father        mid 15s  . Skin cancer Brother     Social History Social History   Tobacco Use  . Smoking status: Never Smoker  . Smokeless tobacco: Never Used  Substance Use Topics  . Alcohol use: Never    Frequency: Never  . Drug use: Never     Allergies   Patient has no known allergies.   Review of Systems Review of Systems  Constitutional: Negative for chills and fever.  Respiratory: Positive for shortness of breath. Negative for cough.   Cardiovascular: Positive for chest pain. Negative for palpitations.  Gastrointestinal: Negative for abdominal pain, nausea and vomiting.  Musculoskeletal: Negative for arthralgias and back pain.  Skin: Negative for color change and rash.  Neurological: Negative for syncope and light-headedness.  Psychiatric/Behavioral: Negative for agitation and confusion.  All other systems reviewed and are negative.    Physical Exam Updated Vital Signs BP (!) 133/94 (BP Location: Right Arm)   Pulse 69  Temp 97.8 F (36.6 C) (Oral)   Resp 13   Ht 5\' 11"  (1.803 m)   Wt 77.2 kg   SpO2 99%   BMI 23.74 kg/m   Physical Exam Vitals signs and nursing note reviewed.  Constitutional:      Appearance: He is well-developed.  HENT:     Head: Normocephalic and atraumatic.  Eyes:     Conjunctiva/sclera: Conjunctivae normal.  Neck:     Musculoskeletal: Neck supple.  Cardiovascular:     Rate and Rhythm: Normal rate and regular rhythm.     Heart sounds: No murmur.  Pulmonary:     Effort: Pulmonary effort is normal. No respiratory distress.     Breath sounds: Normal breath sounds.  Abdominal:     Palpations: Abdomen is soft.      Tenderness: There is no abdominal tenderness.  Skin:    General: Skin is warm and dry.  Neurological:     General: No focal deficit present.     Mental Status: He is alert and oriented to person, place, and time.      ED Treatments / Results  Labs (all labs ordered are listed, but only abnormal results are displayed) Labs Reviewed  BASIC METABOLIC PANEL  CBC  TROPONIN I (HIGH SENSITIVITY)    EKG EKG Interpretation  Date/Time:  Friday December 14 2018 19:38:08 EST Ventricular Rate:  74 PR Interval:  136 QRS Duration: 84 QT Interval:  380 QTC Calculation: 421 R Axis:   84 Text Interpretation: Normal sinus rhythm Normal ECG No STEMI Confirmed by Octaviano Glow [phone]) on 12/14/2018 8:08:07 PM   Radiology Dg Chest 2 View  Result Date: 12/14/2018 CLINICAL DATA:  Left-sided chest pain EXAM: CHEST - 2 VIEW COMPARISON:  None. FINDINGS: No consolidation or effusion. Normal heart size. No pneumothorax. Streaky atelectasis or scarring at the lingula and left base. IMPRESSION: No active cardiopulmonary disease. Electronically Signed   By: Donavan Foil M.D.   On: 12/14/2018 19:49    Procedures Procedures (including critical care time)  Medications Ordered in ED Medications  sodium chloride flush (NS) 0.9 % injection 3 mL (3 mLs Intravenous Given 12/14/18 2140)     Initial Impression / Assessment and Plan / ED Course  I have reviewed the triage vital signs and the nursing notes.  Pertinent labs & imaging results that were available during my care of the patient were reviewed by me and considered in my medical decision making (see chart for details).  This a 48 year old male presents to the emergency department with chest pain.  Symptoms began last night while he was laying down and sleeping in bed.  Improved after he was up walking around, as well as this morning after is up walking around.  This is most consistent with reflux.  We discussed dietary and sleeping modifications  to help with the symptoms.  Otherwise his work-up today is benign.  He has a negative troponin and benign EKG.  Lower suspicion for cardiac disease or arrhythmia.  PERC negative - do not suspect PE  No evidence of PNA or PTX on chest xray    This note was dictated using dragon dictation software.  Please be aware that there may be minor translation errors as a result of this oral dictation    Final Clinical Impressions(s) / ED Diagnoses   Final diagnoses:  Chest pain, unspecified type    ED Discharge Orders    None       Francetta Ilg, Carola Rhine, MD 12/14/18  2225  

## 2018-12-15 DIAGNOSIS — R079 Chest pain, unspecified: Secondary | ICD-10-CM | POA: Diagnosis not present

## 2018-12-15 DIAGNOSIS — R0602 Shortness of breath: Secondary | ICD-10-CM | POA: Diagnosis not present

## 2019-11-20 DIAGNOSIS — M25561 Pain in right knee: Secondary | ICD-10-CM | POA: Diagnosis not present

## 2019-12-03 DIAGNOSIS — M2341 Loose body in knee, right knee: Secondary | ICD-10-CM | POA: Diagnosis not present

## 2019-12-25 DIAGNOSIS — G8918 Other acute postprocedural pain: Secondary | ICD-10-CM | POA: Diagnosis not present

## 2019-12-25 DIAGNOSIS — M1711 Unilateral primary osteoarthritis, right knee: Secondary | ICD-10-CM | POA: Diagnosis not present

## 2019-12-25 DIAGNOSIS — M659 Synovitis and tenosynovitis, unspecified: Secondary | ICD-10-CM | POA: Diagnosis not present

## 2019-12-25 DIAGNOSIS — M2341 Loose body in knee, right knee: Secondary | ICD-10-CM | POA: Diagnosis not present

## 2019-12-25 DIAGNOSIS — M6751 Plica syndrome, right knee: Secondary | ICD-10-CM | POA: Diagnosis not present

## 2019-12-25 DIAGNOSIS — M94261 Chondromalacia, right knee: Secondary | ICD-10-CM | POA: Diagnosis not present

## 2019-12-25 DIAGNOSIS — M65861 Other synovitis and tenosynovitis, right lower leg: Secondary | ICD-10-CM | POA: Diagnosis not present

## 2019-12-31 DIAGNOSIS — Z9889 Other specified postprocedural states: Secondary | ICD-10-CM | POA: Insufficient documentation

## 2020-01-01 DIAGNOSIS — M6281 Muscle weakness (generalized): Secondary | ICD-10-CM | POA: Diagnosis not present

## 2020-01-01 DIAGNOSIS — Z4789 Encounter for other orthopedic aftercare: Secondary | ICD-10-CM | POA: Diagnosis not present

## 2020-01-01 DIAGNOSIS — M25561 Pain in right knee: Secondary | ICD-10-CM | POA: Diagnosis not present

## 2020-01-06 DIAGNOSIS — Z1211 Encounter for screening for malignant neoplasm of colon: Secondary | ICD-10-CM | POA: Diagnosis not present

## 2020-01-08 DIAGNOSIS — Z4789 Encounter for other orthopedic aftercare: Secondary | ICD-10-CM | POA: Diagnosis not present

## 2020-01-08 DIAGNOSIS — M25561 Pain in right knee: Secondary | ICD-10-CM | POA: Diagnosis not present

## 2020-01-08 DIAGNOSIS — M6281 Muscle weakness (generalized): Secondary | ICD-10-CM | POA: Diagnosis not present

## 2020-01-13 DIAGNOSIS — Z4789 Encounter for other orthopedic aftercare: Secondary | ICD-10-CM | POA: Diagnosis not present

## 2020-01-13 DIAGNOSIS — M25561 Pain in right knee: Secondary | ICD-10-CM | POA: Diagnosis not present

## 2020-01-13 DIAGNOSIS — M6281 Muscle weakness (generalized): Secondary | ICD-10-CM | POA: Diagnosis not present

## 2020-01-22 ENCOUNTER — Other Ambulatory Visit: Payer: Self-pay

## 2020-01-22 ENCOUNTER — Encounter: Payer: Self-pay | Admitting: Physician Assistant

## 2020-01-22 ENCOUNTER — Ambulatory Visit: Payer: BC Managed Care – PPO | Admitting: Physician Assistant

## 2020-01-22 VITALS — BP 140/100 | HR 77 | Temp 97.6°F | Ht 71.0 in | Wt 178.2 lb

## 2020-01-22 DIAGNOSIS — R519 Headache, unspecified: Secondary | ICD-10-CM

## 2020-01-22 DIAGNOSIS — G8929 Other chronic pain: Secondary | ICD-10-CM | POA: Diagnosis not present

## 2020-01-22 DIAGNOSIS — G4730 Sleep apnea, unspecified: Secondary | ICD-10-CM | POA: Diagnosis not present

## 2020-01-22 NOTE — Progress Notes (Signed)
Jesus Jimenez is a 49 y.o. male here for a new problem.  I acted as a Education administrator for [employer], PA-C Anselmo Pickler, LPN   History of Present Illness:   Chief Complaint  Patient presents with  . Headache    HPI   Headache Pt c/o increase in headaches past 3 months. He is c/o headache 2-3 times a week. Headaches are in the temporal area. Denies blurred vision with headaches, no sensitivity to light, no nausea. He has been having some generalized blurred vision but not related to headaches, has not seen an eye doctor in awhile. He was seen at GI two weeks and diastolic was elevated at 90. Scheduled for Colonoscopy tomorrow.  BP Readings from Last 3 Encounters:  01/22/20 (!) 140/100  12/14/18 (!) 128/96  11/10/17 120/70   HA yesterday was worst HA of life -- sudden onset that required him to stop working out due to severity was a 8/10, lasted 2 minutes.  Was checked for sleep apnea about 10-15 years ago. Was told he was "borderline." Wife has noticed that over the past few months he has had increased snoring.   No excessive caffeine. Eats about one meal a day -- this is normal for him. Sleeps 10p-6a. Drinks water throughout the day -- around 1/2 gallon. No sugary drinks, no alcohol.  BP at home is 120-130/65-90.    Past Medical History:  Diagnosis Date  . Colon polyps      Social History   Tobacco Use  . Smoking status: Never Smoker  . Smokeless tobacco: Never Used  Vaping Use  . Vaping Use: Never used  Substance Use Topics  . Alcohol use: Never  . Drug use: Never    Past Surgical History:  Procedure Laterality Date  . NASAL SEPTUM SURGERY    . NASAL SINUS SURGERY      Family History  Problem Relation Age of Onset  . Prostate cancer Father        mid 1s  . Skin cancer Brother   . Parkinson's disease Paternal Grandfather     No Known Allergies  Current Medications:   Current Outpatient Medications:  .  ibuprofen (ADVIL) 600 MG tablet, Take 600 mg  by mouth 3 (three) times daily., Disp: , Rfl:  .  oxyCODONE (OXY IR/ROXICODONE) 5 MG immediate release tablet, Take 5 mg by mouth 2 (two) times daily as needed., Disp: , Rfl:  .  SUPREP BOWEL PREP KIT 17.5-3.13-1.6 GM/177ML SOLN, Take by mouth. (Patient not taking: Reported on 01/22/2020), Disp: , Rfl:    Review of Systems:   ROS  Negative unless otherwise specified per HPI.   Vitals:   Vitals:   01/22/20 1444 01/22/20 1523  BP: (!) 132/96 (!) 140/100  Pulse: 78 77  Temp: 97.6 F (36.4 C)   TempSrc: Temporal   SpO2: 95%   Weight: 178 lb 4 oz (80.9 kg)   Height: _0  (1.803 m)      Body mass index is 24.86 kg/m.  Physical Exam:   Physical Exam Vitals and nursing note reviewed.  Constitutional:      General: He is not in acute distress.    Appearance: He is well-developed. He is not ill-appearing, toxic-appearing or sickly-appearing.  Cardiovascular:     Rate and Rhythm: Normal rate and regular rhythm.     Pulses: Normal pulses.     Heart sounds: Normal heart sounds, S1 normal and S2 normal.     Comments: No LE edema Pulmonary:  Effort: Pulmonary effort is normal.     Breath sounds: Normal breath sounds.  Skin:    General: Skin is warm, dry and intact.  Neurological:     General: No focal deficit present.     Mental Status: He is alert.     GCS: GCS eye subscore is 4. GCS verbal subscore is 5. GCS motor subscore is 6.     Cranial Nerves: Cranial nerves are intact.     Sensory: Sensation is intact.     Motor: Motor function is intact.     Coordination: Coordination is intact.     Gait: Gait is intact.  Psychiatric:        Mood and Affect: Mood and affect normal.        Speech: Speech normal.        Behavior: Behavior normal. Behavior is cooperative.      Assessment and Plan:   Nesta was seen today for headache.  Diagnoses and all orders for this visit:  Chronic nonintractable headache, unspecified headache type Due to unusual HA presentation  yesterday, red flag symptoms with sudden onset and severity, will order stat head CT. Update blood work today. Was advised as follows: As long as your CT is normal, we will have you work on: --seeing an eye doctor --having updated sleep study (we will put this order in for you if you'd like) --drinking at least 64 oz water daily --trialing consistent carbohydrate and protein intake throughout the day --having good set-up at your work computer to prevent tension headaches/eye strain --follow-up with Dr. Jerline Pain  Worsening precautions also discussed. -     CT HEAD WO CONTRAST; Future -     CBC with Differential/Platelet -     Comprehensive metabolic panel -     Lipid panel -     TSH  Sleep-disordered breathing Concern for worsening OSA. Will refer for updated sleep study. -     Ambulatory referral to Sleep Studies   CMA or LPN served as scribe during this visit. History, Physical, and Plan performed by medical provider. The above documentation has been reviewed and is accurate and complete.  Inda Coke, PA-C

## 2020-01-22 NOTE — Patient Instructions (Addendum)
It was great to see you!   We are getting a head CT to rule out concerning cause for headaches. We are going to call you with when and where to get this done.  As long as your CT is normal, we will have you work on: --seeing an eye doctor --having updated sleep study (we will put this order in for you if you'd like) --drinking at least 64 oz water daily --trialing consistent carbohydrate and protein intake throughout the day --having good set-up at your work computer to prevent tension headaches/eye strain --follow-up with Dr. Jimmey Ralph  Get help right away if:  Your headache gets very bad quickly.  Your headache gets worse after a lot of physical activity.  You keep throwing up.  You have a stiff neck.  You have trouble seeing.  You have trouble speaking.  You have pain in the eye or ear.  Your muscles are weak or you lose muscle control.  You lose your balance or have trouble walking.  You feel like you will pass out (faint) or you pass out.  You are mixed up (confused).  You have a seizure.  Take care,  Jarold Motto PA-C

## 2020-01-23 ENCOUNTER — Ambulatory Visit (INDEPENDENT_AMBULATORY_CARE_PROVIDER_SITE_OTHER): Payer: BC Managed Care – PPO

## 2020-01-23 DIAGNOSIS — R519 Headache, unspecified: Secondary | ICD-10-CM | POA: Diagnosis not present

## 2020-01-23 DIAGNOSIS — K635 Polyp of colon: Secondary | ICD-10-CM | POA: Diagnosis not present

## 2020-01-23 DIAGNOSIS — Z1211 Encounter for screening for malignant neoplasm of colon: Secondary | ICD-10-CM | POA: Diagnosis not present

## 2020-01-23 DIAGNOSIS — K5730 Diverticulosis of large intestine without perforation or abscess without bleeding: Secondary | ICD-10-CM | POA: Diagnosis not present

## 2020-01-23 DIAGNOSIS — D120 Benign neoplasm of cecum: Secondary | ICD-10-CM | POA: Diagnosis not present

## 2020-01-23 LAB — CBC WITH DIFFERENTIAL/PLATELET
Basophils Absolute: 0 10*3/uL (ref 0.0–0.1)
Basophils Relative: 0.4 % (ref 0.0–3.0)
Eosinophils Absolute: 0.5 10*3/uL (ref 0.0–0.7)
Eosinophils Relative: 4.2 % (ref 0.0–5.0)
HCT: 48.1 % (ref 39.0–52.0)
Hemoglobin: 16 g/dL (ref 13.0–17.0)
Lymphocytes Relative: 14.5 % (ref 12.0–46.0)
Lymphs Abs: 1.6 10*3/uL (ref 0.7–4.0)
MCHC: 33.3 g/dL (ref 30.0–36.0)
MCV: 92.5 fl (ref 78.0–100.0)
Monocytes Absolute: 0.7 10*3/uL (ref 0.1–1.0)
Monocytes Relative: 6.4 % (ref 3.0–12.0)
Neutro Abs: 8.3 10*3/uL — ABNORMAL HIGH (ref 1.4–7.7)
Neutrophils Relative %: 74.5 % (ref 43.0–77.0)
Platelets: 310 10*3/uL (ref 150.0–400.0)
RBC: 5.2 Mil/uL (ref 4.22–5.81)
RDW: 12.9 % (ref 11.5–15.5)
WBC: 11.2 10*3/uL — ABNORMAL HIGH (ref 4.0–10.5)

## 2020-01-23 LAB — COMPREHENSIVE METABOLIC PANEL
ALT: 45 U/L (ref 0–53)
AST: 28 U/L (ref 0–37)
Albumin: 4.7 g/dL (ref 3.5–5.2)
Alkaline Phosphatase: 48 U/L (ref 39–117)
BUN: 12 mg/dL (ref 6–23)
CO2: 29 mEq/L (ref 19–32)
Calcium: 9.4 mg/dL (ref 8.4–10.5)
Chloride: 104 mEq/L (ref 96–112)
Creatinine, Ser: 0.99 mg/dL (ref 0.40–1.50)
GFR: 89.92 mL/min (ref >60.00)
Glucose, Bld: 77 mg/dL (ref 70–99)
Potassium: 4 mEq/L (ref 3.5–5.1)
Sodium: 139 mEq/L (ref 135–145)
Total Bilirubin: 0.7 mg/dL (ref 0.2–1.2)
Total Protein: 7.5 g/dL (ref 6.0–8.3)

## 2020-01-23 LAB — LIPID PANEL
Cholesterol: 140 mg/dL (ref 0–200)
HDL: 48.7 mg/dL (ref >39.00)
LDL Cholesterol: 75 mg/dL (ref 0–99)
NonHDL: 91.67
Total CHOL/HDL Ratio: 3
Triglycerides: 85 mg/dL (ref 0.0–149.0)
VLDL: 17 mg/dL (ref 0.0–40.0)

## 2020-01-23 LAB — TSH: TSH: 2.12 u[IU]/mL (ref 0.35–4.50)

## 2020-01-23 LAB — HM COLONOSCOPY

## 2020-01-30 ENCOUNTER — Encounter: Payer: Self-pay | Admitting: Family Medicine

## 2020-03-03 ENCOUNTER — Encounter: Payer: Self-pay | Admitting: Family Medicine

## 2020-05-02 DIAGNOSIS — Z20822 Contact with and (suspected) exposure to covid-19: Secondary | ICD-10-CM | POA: Diagnosis not present

## 2021-04-02 ENCOUNTER — Ambulatory Visit (INDEPENDENT_AMBULATORY_CARE_PROVIDER_SITE_OTHER): Payer: BC Managed Care – PPO | Admitting: Family Medicine

## 2021-04-02 VITALS — BP 127/89 | HR 80 | Temp 97.9°F | Ht 71.0 in | Wt 179.2 lb

## 2021-04-02 DIAGNOSIS — Z13220 Encounter for screening for lipoid disorders: Secondary | ICD-10-CM

## 2021-04-02 DIAGNOSIS — Z0001 Encounter for general adult medical examination with abnormal findings: Secondary | ICD-10-CM

## 2021-04-02 DIAGNOSIS — G4730 Sleep apnea, unspecified: Secondary | ICD-10-CM

## 2021-04-02 LAB — COMPREHENSIVE METABOLIC PANEL
ALT: 26 U/L (ref 0–53)
AST: 18 U/L (ref 0–37)
Albumin: 4.6 g/dL (ref 3.5–5.2)
Alkaline Phosphatase: 51 U/L (ref 39–117)
BUN: 14 mg/dL (ref 6–23)
CO2: 28 mEq/L (ref 19–32)
Calcium: 9.7 mg/dL (ref 8.4–10.5)
Chloride: 103 mEq/L (ref 96–112)
Creatinine, Ser: 0.99 mg/dL (ref 0.40–1.50)
GFR: 89.17 mL/min (ref >60.00)
Glucose, Bld: 86 mg/dL (ref 70–99)
Potassium: 4.2 mEq/L (ref 3.5–5.1)
Sodium: 139 mEq/L (ref 135–145)
Total Bilirubin: 0.9 mg/dL (ref 0.2–1.2)
Total Protein: 7.2 g/dL (ref 6.0–8.3)

## 2021-04-02 LAB — LIPID PANEL
Cholesterol: 141 mg/dL (ref 0–200)
HDL: 44.4 mg/dL (ref >39.00)
LDL Cholesterol: 79 mg/dL (ref 0–99)
NonHDL: 96.14
Total CHOL/HDL Ratio: 3
Triglycerides: 85 mg/dL (ref 0.0–149.0)
VLDL: 17 mg/dL (ref 0.0–40.0)

## 2021-04-02 LAB — TSH: TSH: 1.81 u[IU]/mL (ref 0.35–5.50)

## 2021-04-02 LAB — CBC
HCT: 48.8 % (ref 39.0–52.0)
Hemoglobin: 16.2 g/dL (ref 13.0–17.0)
MCHC: 33.2 g/dL (ref 30.0–36.0)
MCV: 93.1 fl (ref 78.0–100.0)
Platelets: 299 10*3/uL (ref 150.0–400.0)
RBC: 5.25 Mil/uL (ref 4.22–5.81)
RDW: 13.5 % (ref 11.5–15.5)
WBC: 5.9 10*3/uL (ref 4.0–10.5)

## 2021-04-02 NOTE — Patient Instructions (Signed)
It was very nice to see you today! ? ?We will refer you for a sleep study. ? ?We will check blood work today. ? ?Please continue work on diet and exercise. ? ?Please come back in 1 year for your next physical.  Come back sooner if needed. ? ?Take care, ?Dr Jimmey Ralph ? ?PLEASE NOTE: ? ?If you had any lab tests please let us know if you have not heard back within a few days. You may see your results on mychart before we have a chance to review them but we will give you a call once they are reviewed by [place]. If we ordered any referrals today, please let us know if you have not heard from their office within the next week.  ? ?Please try these tips to maintain a healthy lifestyle: ? ?Eat at least 3 REAL meals and 1-2 snacks per day.  Aim for no more than 5 hours between eating.  If you eat breakfast, please do so within one hour of getting up.  ? ?Each meal should contain half fruits/vegetables, one quarter protein, and one quarter carbs (no bigger than a computer mouse) ? ?Cut down on sweet beverages. This includes juice, soda, and sweet tea.  ? ?Drink at least 1 glass of water with each meal and aim for at least 8 glasses per day ? ?Exercise at least 150 minutes every week.   ? ?Preventive Care 7-53 Years Old, Male ?Preventive care refers to lifestyle choices and visits with your health care provider that can promote health and wellness. Preventive care visits are also called wellness exams. ?What can I expect for my preventive care visit? ?Counseling ?During your preventive care visit, your health care provider may ask about your: ?Medical history, including: ?Past medical problems. ?Family medical history. ?Current health, including: ?Emotional well-being. ?Home life and relationship well-being. ?Sexual activity. ?Lifestyle, including: ?Alcohol, nicotine or tobacco, and drug use. ?Access to firearms. ?Diet, exercise, and sleep habits. ?Safety issues such as seatbelt and bike helmet use. ?Sunscreen use. ?Work and work  Astronomer. ?Physical exam ?Your health care provider will check your: ?Height and weight. These may be used to calculate your BMI (body mass index). BMI is a measurement that tells if you are at a healthy weight. ?Waist circumference. This measures the distance around your waistline. This measurement also tells if you are at a healthy weight and may help predict your risk of certain diseases, such as type 2 diabetes and high blood pressure. ?Heart rate and blood pressure. ?Body temperature. ?Skin for abnormal spots. ?What immunizations do I need? ?Vaccines are usually given at various ages, according to a schedule. Your health care provider will recommend vaccines for you based on your age, medical history, and lifestyle or other factors, such as travel or where you work. ?What tests do I need? ?Screening ?Your health care provider may recommend screening tests for certain conditions. This may include: ?Lipid and cholesterol levels. ?Diabetes screening. This is done by checking your blood sugar (glucose) after you have not eaten for a while (fasting). ?Hepatitis B test. ?Hepatitis C test. ?HIV (human immunodeficiency virus) test. ?STI (sexually transmitted infection) testing, if you are at risk. ?Lung cancer screening. ?Prostate cancer screening. ?Colorectal cancer screening. ?Talk with your health care provider about your test results, treatment options, and if necessary, the need for more tests. ?Follow these instructions at home: ?Eating and drinking ? ?Eat a diet that includes fresh fruits and vegetables, whole grains, lean protein, and low-fat dairy products. ?Take  vitamin and mineral supplements as recommended by your health care provider. ?Do not drink alcohol if your health care provider tells you not to drink. ?If you drink alcohol: ?Limit how much you have to 0-2 drinks a day. ?Know how much alcohol is in your drink. In the [place] one drink equals one 12 oz bottle of beer (355 mL), one 5 oz glass of wine  (148 mL), or one 1? oz glass of hard liquor (44 mL). ?Lifestyle ?Brush your teeth every morning and night with fluoride toothpaste. Floss one time each day. ?Exercise for at least 30 minutes 5 or more days each week. ?Do not use any products that contain nicotine or tobacco. These products include cigarettes, chewing tobacco, and vaping devices, such as e-cigarettes. If you need help quitting, ask your health care provider. ?Do not use drugs. ?If you are sexually active, practice safe sex. Use a condom or other form of protection to prevent STIs. ?Take aspirin only as told by your health care provider. Make sure that you understand how much to take and what form to take. Work with your health care provider to find out whether it is safe and beneficial for you to take aspirin daily. ?Find healthy ways to manage stress, such as: ?Meditation, yoga, or listening to music. ?Journaling. ?Talking to a trusted person. ?Spending time with friends and family. ?Minimize exposure to UV radiation to reduce your risk of skin cancer. ?Safety ?Always wear your seat belt while driving or riding in a vehicle. ?Do not drive: ?If you have been drinking alcohol. Do not ride with someone who has been drinking. ?When you are tired or distracted. ?While texting. ?If you have been using any mind-altering substances or drugs. ?Wear a helmet and other protective equipment during sports activities. ?If you have firearms in your house, make sure you follow all gun safety procedures. ?What's next? ?Go to your health care provider once a year for an annual wellness visit. ?Ask your health care provider how often you should have your eyes and teeth checked. ?Stay up to date on all vaccines. ?This information is not intended to replace advice given to you by your health care provider. Make sure you discuss any questions you have with your health care provider. ?Document Revised: 07/08/2020 Document Reviewed: 07/08/2020 ?Elsevier Patient Education ?  2022 [employer] ? ?

## 2021-04-02 NOTE — Assessment & Plan Note (Signed)
Will refer to sleep medicine 

## 2021-04-02 NOTE — Progress Notes (Signed)
? ?Chief Complaint:  ?Jesus Jimenez is a 51 y.o. male who presents today for his annual comprehensive physical exam.   ? ?Assessment/Plan:  ?Chronic Problems Addressed Today: ?Sleep-disordered breathing ?Will refer to sleep medicine.  ? ?Preventative Healthcare: ?Check labs. UTD on colon cancer screening.  ? ?Patient Counseling(The following topics were reviewed and/or handout was given): ? -Nutrition: Stressed importance of moderation in sodium/caffeine intake, saturated fat and cholesterol, caloric balance, sufficient intake of fresh fruits, vegetables, and fiber. ? -Stressed the importance of regular exercise.  ? -Substance Abuse: Discussed cessation/primary prevention of tobacco, alcohol, or other drug use; driving or other dangerous activities under the influence; availability of treatment for abuse.  ? -Injury prevention: Discussed safety belts, safety helmets, smoke detector, smoking near bedding or upholstery.  ? -Sexuality: Discussed sexually transmitted diseases, partner selection, use of condoms, avoidance of unintended pregnancy and contraceptive alternatives.  ? -Dental health: Discussed importance of regular tooth brushing, flossing, and dental visits. ? -Health maintenance and immunizations reviewed. Please refer to Health maintenance section. ? ?Return to care in 1 year for next preventative visit.  ? ?  ?Subjective:  ?HPI: ? ?He has no acute complaints today.  ? ?Still has issues with excessive snoring.  He had a sleep study done about 10 years ago which showed "borderline" OSA.  He has not been on CPAP. ? ?Lifestyle ?Diet: Limited.  ?Exercise: Likes running.  ? ?Depression screen [facility] 2/9 04/02/2021  ?Decreased Interest 0  ?Down, Depressed, Hopeless 0  ?PHQ - 2 Score 0  ? ?ROS: Per HPI, otherwise a complete review of systems was negative.  ? ?PMH: ? ?The following were reviewed and entered/updated in epic: ?Past Medical History:  ?Diagnosis Date  ? Colon polyps   ? ?Patient Active Problem List  ?  Diagnosis Date Noted  ? S/P right knee arthroscopy 12/31/2019  ? Sleep-disordered breathing 05/23/2017  ? Polyp of colon 05/23/2017  ? Abdominal mass 05/23/2017  ? ?Past Surgical History:  ?Procedure Laterality Date  ? NASAL SEPTUM SURGERY    ? NASAL SINUS SURGERY    ? ? ?Family History  ?Problem Relation Age of Onset  ? Prostate cancer Father   ?     mid 72s  ? Skin cancer Brother   ? Parkinson's disease Paternal Grandfather   ? ? ?Medications- reviewed and updated ?Current Outpatient Medications  ?Medication Sig Dispense Refill  ? ibuprofen (ADVIL) 600 MG tablet Take 600 mg by mouth 3 (three) times daily.    ? SUPREP BOWEL PREP KIT 17.5-3.13-1.6 GM/177ML SOLN Take by mouth.    ? ?No current facility-administered medications for this visit.  ? ? ?Allergies-reviewed and updated ?No Known Allergies ? ?Social History  ? ?Socioeconomic History  ? Marital status: Married  ?  Spouse name: Not on file  ? Number of children: 4  ? Years of education: Not on file  ? Highest education level: Not on file  ?Occupational History  ? Not on file  ?Tobacco Use  ? Smoking status: Never  ? Smokeless tobacco: Never  ?Vaping Use  ? Vaping Use: Never used  ?Substance and Sexual Activity  ? Alcohol use: Never  ? Drug use: Never  ? Sexual activity: Not on file  ?Other Topics Concern  ? Not on file  ?Social History Narrative  ? Not on file  ? ?Social Determinants of Health  ? ?Financial Resource Strain: Not on file  ?Food Insecurity: Not on file  ?Transportation Needs: Not on file  ?Physical Activity:  Not on file  ?Stress: Not on file  ?Social Connections: Not on file  ? ?   ?  ?Objective:  ?Physical Exam: ?BP 127/89 (BP Location: Left Arm)   Pulse 80   Temp 97.9 ?F (36.6 ?C) (Temporal)   Ht _0  (1.803 m)   Wt 179 lb 3.2 oz (81.3 kg)   SpO2 97%   BMI 24.99 kg/m?   ?Body mass index is 24.99 kg/m?. ?Wt Readings from Last 3 Encounters:  ?04/02/21 179 lb 3.2 oz (81.3 kg)  ?01/22/20 178 lb 4 oz (80.9 kg)  ?12/14/18 170 lb 3.1 oz (77.2  kg)  ? ?Gen: NAD, resting comfortably ?HEENT: TMs normal bilaterally. OP clear. No thyromegaly noted.  ?CV: RRR with no murmurs appreciated ?Pulm: NWOB, CTAB with no crackles, wheezes, or rhonchi ?GI: Normal bowel sounds present. Soft, Nontender, Nondistended. ?MSK: no edema, cyanosis, or clubbing noted ?Skin: warm, dry ?Neuro: CN2-12 grossly intact. Strength 5/5 in upper and lower extremities. Reflexes symmetric and intact bilaterally.  ?Psych: Normal affect and thought content ?   ? ?Algis Greenhouse. Jerline Pain, MD ?04/02/2021 9:07 AM  ?

## 2021-04-02 NOTE — Progress Notes (Signed)
Please inform patient of the following: ? ?Good news! Labs are all NORMAL. We can recheck next year. ? ?Katina Degree. Jimmey Ralph, MD ?04/02/2021 3:36 PM  ?

## 2021-08-25 IMAGING — CT CT HEAD W/O CM
3 of 4 series · 15 of 47 positions shown, 18 images · non-contrast
Comparison: None.

CLINICAL DATA: 49-year-old male with headache

EXAM:
CT HEAD WITHOUT CONTRAST
TECHNIQUE: Contiguous axial images were obtained from the base of the skull
through the vertex without intravenous contrast.

[Series 2: head wo · axial · 0.43mm/px · z∈[+1064,+1184]mm · 9 of 30 slices shown, 12 images]
[im 3/30  brain]
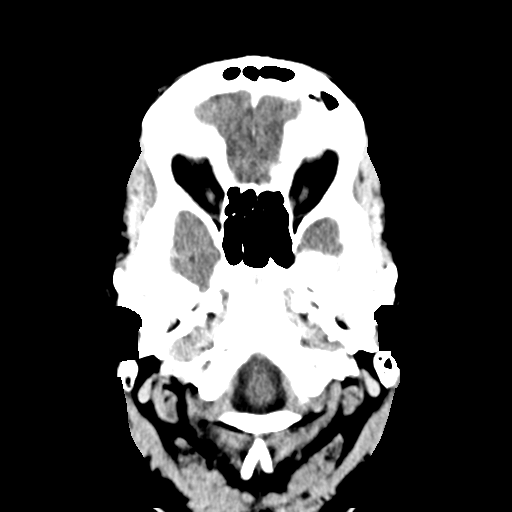
[im 3/30  bone]
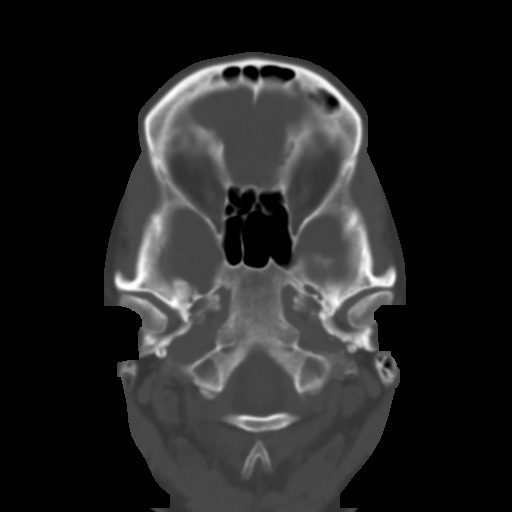
[im 7/30  brain]
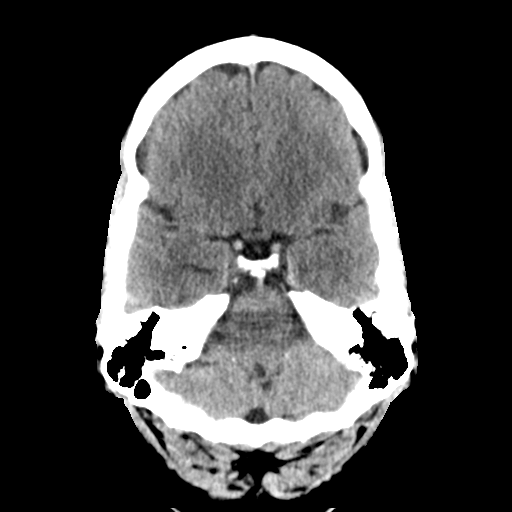
[im 9/30  brain]
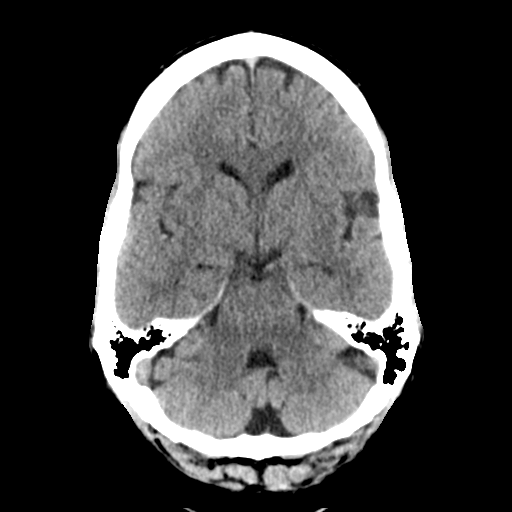
[im 13/30  brain]
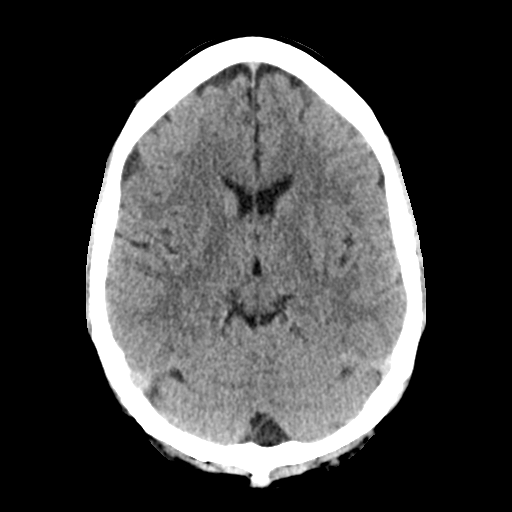
[im 15/30  brain]
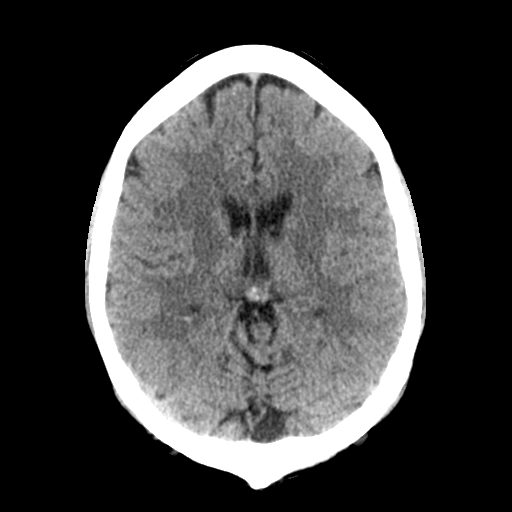
[im 15/30  bone]
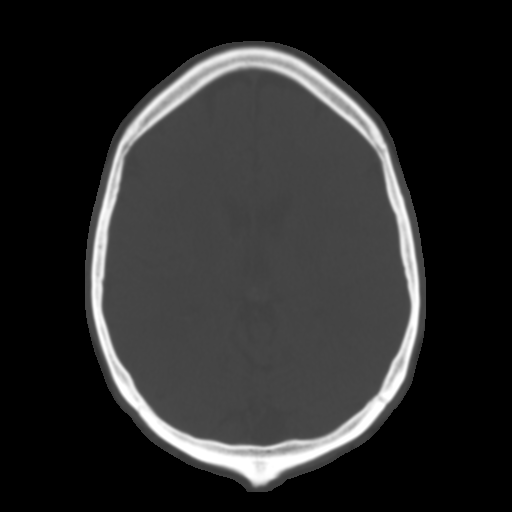
[im 17/30  brain]
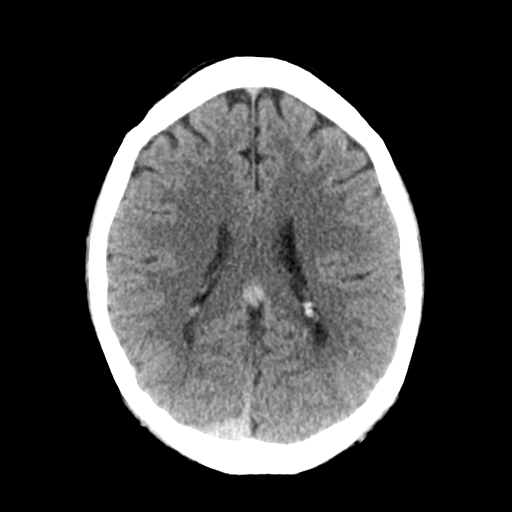
[im 21/30  brain]
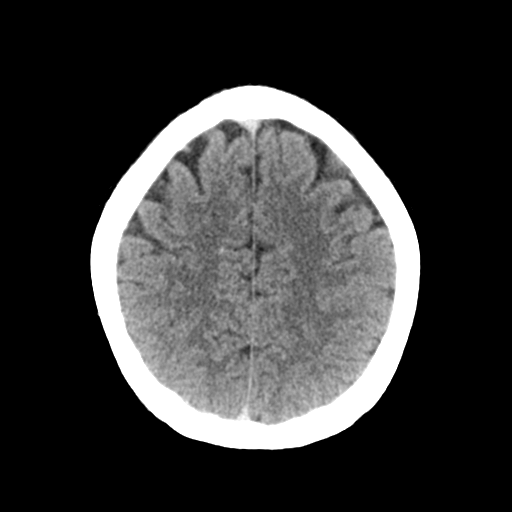
[im 23/30  brain]
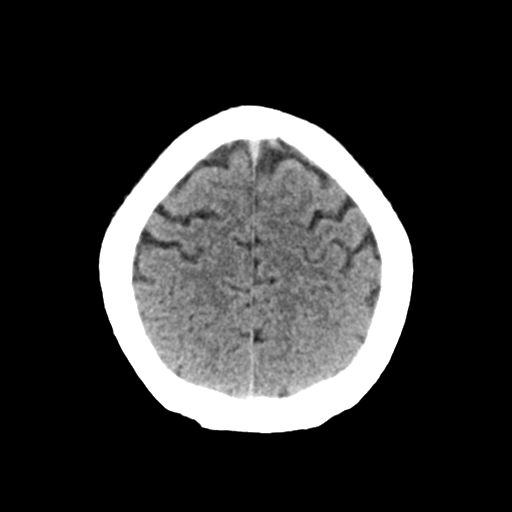
[im 27/30  brain]
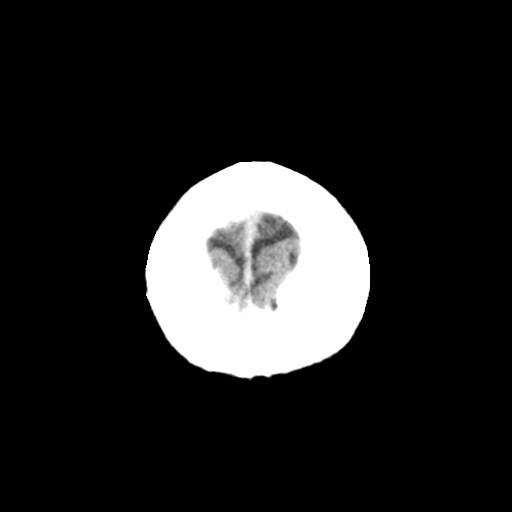
[im 27/30  bone]
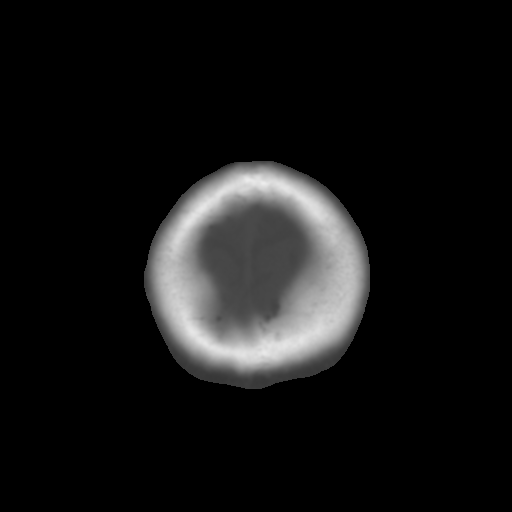

[Series 4: head wo coronal · coronal · 0.29mm/px · 3 of 69 slices shown]
[im 23/69  brain]
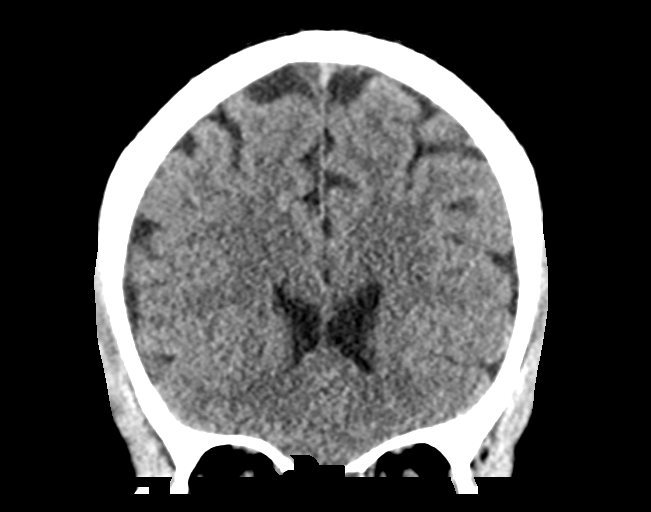
[im 31/69  brain]
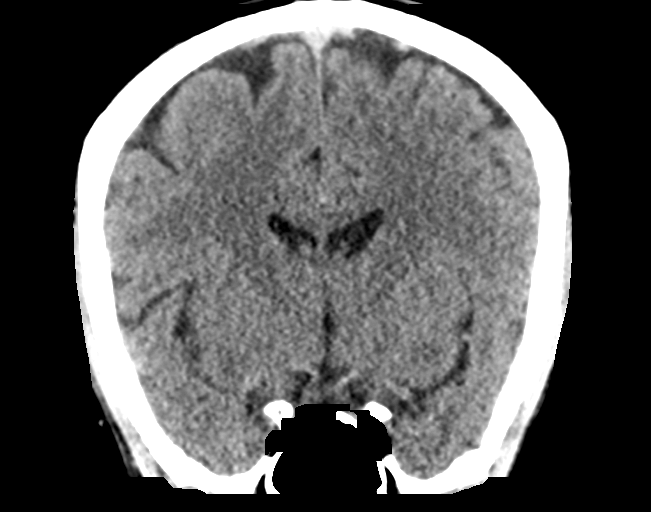
[im 38/69  brain]
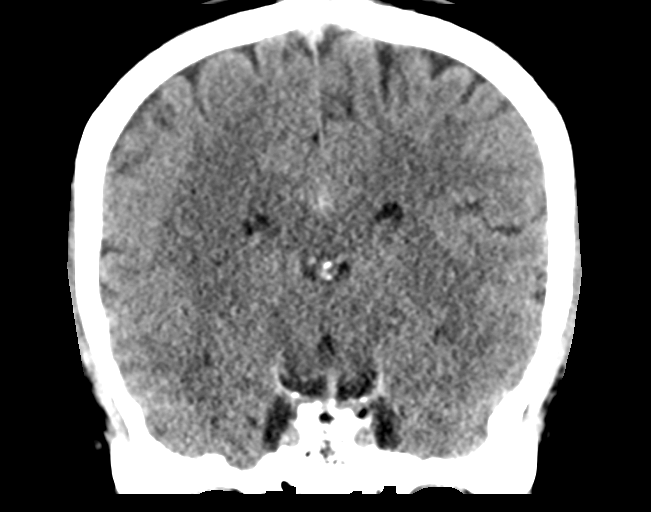

[Series 5: head wo sagittal · sagittal · 0.30mm/px · 3 of 54 slices shown]
[im 18/54  brain]
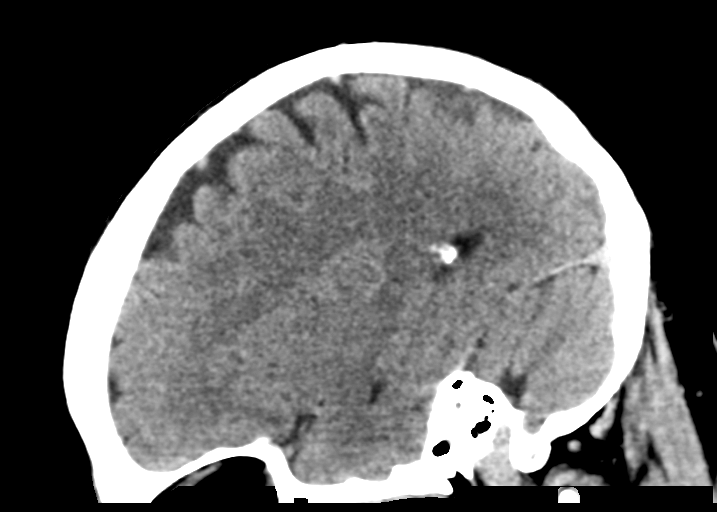
[im 27/54  brain]
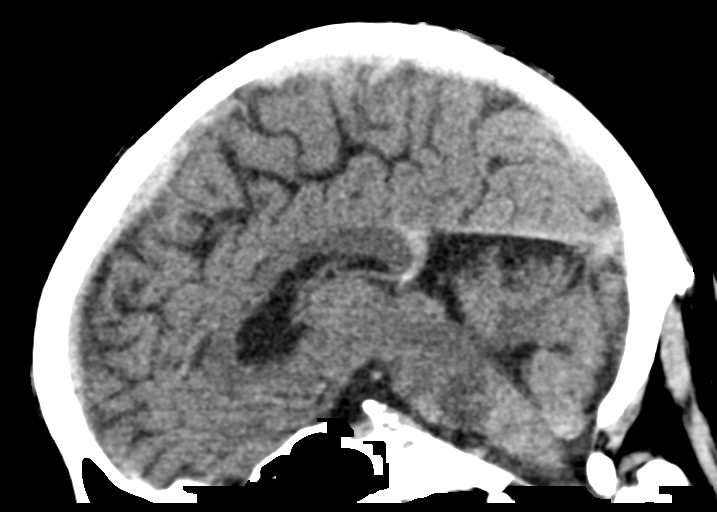
[im 36/54  brain]
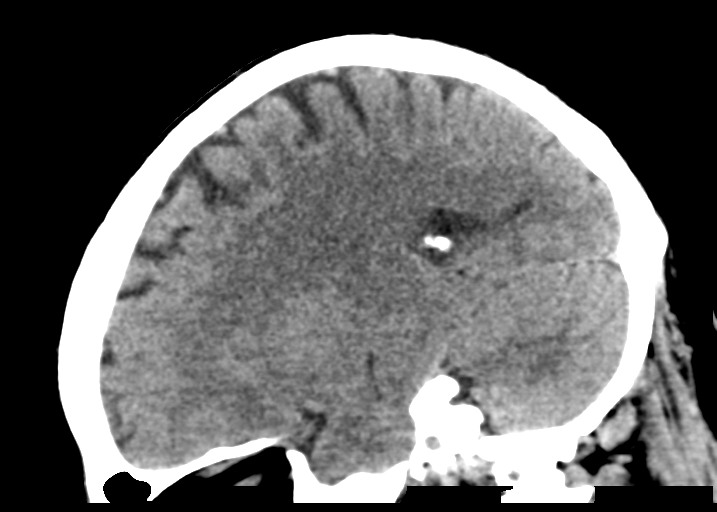

[15 of 47 positions shown; findings below may reference images not displayed]

FINDINGS: Brain: No acute intracranial hemorrhage. No midline shift or mass
effect. Gray-white differentiation maintained. Unremarkable
appearance of the ventricular system.

Vascular: Unremarkable.

Skull: No acute fracture.  No aggressive bone lesion identified.

Sinuses/Orbits: Unremarkable appearance of the orbits. Mastoid air
cells clear. No middle ear effusion. No significant sinus disease.

Other: None
IMPRESSION: Negative head CT

## 2021-10-18 ENCOUNTER — Encounter: Payer: Self-pay | Admitting: *Deleted

## 2022-01-06 ENCOUNTER — Encounter: Payer: Self-pay | Admitting: *Deleted

## 2022-01-12 DIAGNOSIS — L814 Other melanin hyperpigmentation: Secondary | ICD-10-CM | POA: Diagnosis not present

## 2022-01-12 DIAGNOSIS — Z85828 Personal history of other malignant neoplasm of skin: Secondary | ICD-10-CM | POA: Diagnosis not present

## 2022-01-12 DIAGNOSIS — L821 Other seborrheic keratosis: Secondary | ICD-10-CM | POA: Diagnosis not present

## 2022-01-12 DIAGNOSIS — D1801 Hemangioma of skin and subcutaneous tissue: Secondary | ICD-10-CM | POA: Diagnosis not present

## 2022-06-07 ENCOUNTER — Ambulatory Visit (INDEPENDENT_AMBULATORY_CARE_PROVIDER_SITE_OTHER): Payer: 59 | Admitting: Family Medicine

## 2022-06-07 ENCOUNTER — Encounter: Payer: Self-pay | Admitting: Family Medicine

## 2022-06-07 VITALS — BP 133/90 | HR 81 | Temp 96.8°F | Ht 71.0 in | Wt 188.2 lb

## 2022-06-07 DIAGNOSIS — G4730 Sleep apnea, unspecified: Secondary | ICD-10-CM

## 2022-06-07 DIAGNOSIS — M25561 Pain in right knee: Secondary | ICD-10-CM

## 2022-06-07 DIAGNOSIS — Z1159 Encounter for screening for other viral diseases: Secondary | ICD-10-CM | POA: Diagnosis not present

## 2022-06-07 DIAGNOSIS — Z13220 Encounter for screening for lipoid disorders: Secondary | ICD-10-CM | POA: Diagnosis not present

## 2022-06-07 DIAGNOSIS — Z131 Encounter for screening for diabetes mellitus: Secondary | ICD-10-CM

## 2022-06-07 DIAGNOSIS — Z0001 Encounter for general adult medical examination with abnormal findings: Secondary | ICD-10-CM

## 2022-06-07 DIAGNOSIS — Z8042 Family history of malignant neoplasm of prostate: Secondary | ICD-10-CM | POA: Diagnosis not present

## 2022-06-07 DIAGNOSIS — Z23 Encounter for immunization: Secondary | ICD-10-CM | POA: Diagnosis not present

## 2022-06-07 LAB — PSA: PSA: 3.84 ng/mL (ref 0.10–4.00)

## 2022-06-07 LAB — CBC
HCT: 48.3 % (ref 39.0–52.0)
Hemoglobin: 16.2 g/dL (ref 13.0–17.0)
MCHC: 33.5 g/dL (ref 30.0–36.0)
MCV: 91.7 fl (ref 78.0–100.0)
Platelets: 314 10*3/uL (ref 150.0–400.0)
RBC: 5.27 Mil/uL (ref 4.22–5.81)
RDW: 13.2 % (ref 11.5–15.5)
WBC: 4.9 10*3/uL (ref 4.0–10.5)

## 2022-06-07 LAB — COMPREHENSIVE METABOLIC PANEL
ALT: 34 U/L (ref 0–53)
AST: 21 U/L (ref 0–37)
Albumin: 4.4 g/dL (ref 3.5–5.2)
Alkaline Phosphatase: 49 U/L (ref 39–117)
BUN: 13 mg/dL (ref 6–23)
CO2: 27 mEq/L (ref 19–32)
Calcium: 9.5 mg/dL (ref 8.4–10.5)
Chloride: 104 mEq/L (ref 96–112)
Creatinine, Ser: 1.03 mg/dL (ref 0.40–1.50)
GFR: 84.33 mL/min (ref >60.00)
Glucose, Bld: 87 mg/dL (ref 70–99)
Potassium: 4.2 mEq/L (ref 3.5–5.1)
Sodium: 141 mEq/L (ref 135–145)
Total Bilirubin: 0.6 mg/dL (ref 0.2–1.2)
Total Protein: 7.3 g/dL (ref 6.0–8.3)

## 2022-06-07 LAB — LIPID PANEL
Cholesterol: 147 mg/dL (ref 0–200)
HDL: 39.4 mg/dL (ref >39.00)
LDL Cholesterol: 97 mg/dL (ref 0–99)
NonHDL: 107.44
Total CHOL/HDL Ratio: 4
Triglycerides: 53 mg/dL (ref 0.0–149.0)
VLDL: 10.6 mg/dL (ref 0.0–40.0)

## 2022-06-07 LAB — TSH: TSH: 1.54 u[IU]/mL (ref 0.35–5.50)

## 2022-06-07 LAB — HEMOGLOBIN A1C: Hgb A1c MFr Bld: 5.3 % (ref 4.6–6.5)

## 2022-06-07 NOTE — Addendum Note (Signed)
Addended by: Dyann Kief on: 06/07/2022 12:25 PM   Modules accepted: Orders

## 2022-06-07 NOTE — Patient Instructions (Addendum)
It was very nice to see you today!  We will check blood work today.  I will refer you for your sleep study.  Please continue to work on diet and exercise.  Return in about 1 year (around 06/07/2023).   Take care, Dr Jimmey Ralph  PLEASE NOTE:  If you had any lab tests, please let us know if you have not heard back within a few days. You may see your results on mychart before we have a chance to review them but we will give you a call once they are reviewed by [place].   If we ordered any referrals today, please let us know if you have not heard from their office within the next week.   If you had any urgent prescriptions sent in today, please check with the pharmacy within an hour of our visit to make sure the prescription was transmitted appropriately.   Please try these tips to maintain a healthy lifestyle:  Eat at least 3 REAL meals and 1-2 snacks per day.  Aim for no more than 5 hours between eating.  If you eat breakfast, please do so within one hour of getting up.   Each meal should contain half fruits/vegetables, one quarter protein, and one quarter carbs (no bigger than a computer mouse)  Cut down on sweet beverages. This includes juice, soda, and sweet tea.   Drink at least 1 glass of water with each meal and aim for at least 8 glasses per day  Exercise at least 150 minutes every week.    Preventive Care 94-72 Years Old, Male Preventive care refers to lifestyle choices and visits with your health care provider that can promote health and wellness. Preventive care visits are also called wellness exams. What can I expect for my preventive care visit? Counseling During your preventive care visit, your health care provider may ask about your: Medical history, including: Past medical problems. Family medical history. Current health, including: Emotional well-being. Home life and relationship well-being. Sexual activity. Lifestyle, including: Alcohol, nicotine or tobacco, and drug  use. Access to firearms. Diet, exercise, and sleep habits. Safety issues such as seatbelt and bike helmet use. Sunscreen use. Work and work Astronomer. Physical exam Your health care provider will check your: Height and weight. These may be used to calculate your BMI (body mass index). BMI is a measurement that tells if you are at a healthy weight. Waist circumference. This measures the distance around your waistline. This measurement also tells if you are at a healthy weight and may help predict your risk of certain diseases, such as type 2 diabetes and high blood pressure. Heart rate and blood pressure. Body temperature. Skin for abnormal spots. What immunizations do I need?  Vaccines are usually given at various ages, according to a schedule. Your health care provider will recommend vaccines for you based on your age, medical history, and lifestyle or other factors, such as travel or where you work. What tests do I need? Screening Your health care provider may recommend screening tests for certain conditions. This may include: Lipid and cholesterol levels. Diabetes screening. This is done by checking your blood sugar (glucose) after you have not eaten for a while (fasting). Hepatitis B test. Hepatitis C test. HIV (human immunodeficiency virus) test. STI (sexually transmitted infection) testing, if you are at risk. Lung cancer screening. Prostate cancer screening. Colorectal cancer screening. Talk with your health care provider about your test results, treatment options, and if necessary, the need for more tests. Follow  these instructions at home: Eating and drinking  Eat a diet that includes fresh fruits and vegetables, whole grains, lean protein, and low-fat dairy products. Take vitamin and mineral supplements as recommended by your health care provider. Do not drink alcohol if your health care provider tells you not to drink. If you drink alcohol: Limit how much you have to  0-2 drinks a day. Know how much alcohol is in your drink. In the [place] one drink equals one 12 oz bottle of beer (355 mL), one 5 oz glass of wine (148 mL), or one 1 oz glass of hard liquor (44 mL). Lifestyle Brush your teeth every morning and night with fluoride toothpaste. Floss one time each day. Exercise for at least 30 minutes 5 or more days each week. Do not use any products that contain nicotine or tobacco. These products include cigarettes, chewing tobacco, and vaping devices, such as e-cigarettes. If you need help quitting, ask your health care provider. Do not use drugs. If you are sexually active, practice safe sex. Use a condom or other form of protection to prevent STIs. Take aspirin only as told by your health care provider. Make sure that you understand how much to take and what form to take. Work with your health care provider to find out whether it is safe and beneficial for you to take aspirin daily. Find healthy ways to manage stress, such as: Meditation, yoga, or listening to music. Journaling. Talking to a trusted person. Spending time with friends and family. Minimize exposure to UV radiation to reduce your risk of skin cancer. Safety Always wear your seat belt while driving or riding in a vehicle. Do not drive: If you have been drinking alcohol. Do not ride with someone who has been drinking. When you are tired or distracted. While texting. If you have been using any mind-altering substances or drugs. Wear a helmet and other protective equipment during sports activities. If you have firearms in your house, make sure you follow all gun safety procedures. What's next? Go to your health care provider once a year for an annual wellness visit. Ask your health care provider how often you should have your eyes and teeth checked. Stay up to date on all vaccines. This information is not intended to replace advice given to you by your health care provider. Make sure you  discuss any questions you have with your health care provider. Document Revised: 07/08/2020 Document Reviewed: 07/08/2020 Elsevier Patient Education  [employer].

## 2022-06-07 NOTE — Assessment & Plan Note (Signed)
Will refer for sleep study.  Discussed importance of treatment for OSA.

## 2022-06-07 NOTE — Progress Notes (Signed)
Chief Complaint:  Jesus Jimenez is a 52 y.o. male who presents today for his annual comprehensive physical exam.    Assessment/Plan:  Chronic Problems Addressed Today: Right knee pain Symptoms are overall stable.  He has seen orthopedics last a couple of years ago.  May need referral again if this continues to be an ongoing issue or if pain worsens.  Sleep-disordered breathing Will refer for sleep study.  Discussed importance of treatment for OSA.  Preventative Healthcare: Check labs.  Tdap and Shingrix given today.  Due for next colonoscopy in a couple of years.  Check PSA today due to family history.  May consider referral for cardiac CT scan depending on results of lipid panel-deferred for today.  Patient Counseling(The following topics were reviewed and/or handout was given):  -Nutrition: Stressed importance of moderation in sodium/caffeine intake, saturated fat and cholesterol, caloric balance, sufficient intake of fresh fruits, vegetables, and fiber.  -Stressed the importance of regular exercise.   -Substance Abuse: Discussed cessation/primary prevention of tobacco, alcohol, or other drug use; driving or other dangerous activities under the influence; availability of treatment for abuse.   -Injury prevention: Discussed safety belts, safety helmets, smoke detector, smoking near bedding or upholstery.   -Sexuality: Discussed sexually transmitted diseases, partner selection, use of condoms, avoidance of unintended pregnancy and contraceptive alternatives.   -Dental health: Discussed importance of regular tooth brushing, flossing, and dental visits.  -Health maintenance and immunizations reviewed. Please refer to Health maintenance section.  Return to care in 1 year for next preventative visit.     Subjective:  HPI:  He has no acute complaints today. See Assessment / plan for status of chronic conditions.   Still has occasional snoring.  Also feels tired throughout the day.  We  referred him for sleep study last year however he was not able to follow-up with them.   Lifestyle Diet: Balanced. Trying to get more fruits and vegetables.  Exercise: Tries to exercise daily but somewhat limited by right knee pain.      06/07/2022    8:30 AM  Depression screen PHQ 2/9  Decreased Interest 0  Down, Depressed, Hopeless 0  PHQ - 2 Score 0    Health Maintenance Due  Topic Date Due   COVID-19 Vaccine (1) Never done   Hepatitis C Screening  Never done   DTaP/Tdap/Td (1 - Tdap) Never done   Zoster Vaccines- Shingrix (1 of 2) Never done     ROS: Per HPI, otherwise a complete review of systems was negative.   PMH:  The following were reviewed and entered/updated in epic: Past Medical History:  Diagnosis Date   Colon polyps    Patient Active Problem List   Diagnosis Date Noted   Right knee pain 06/07/2022   S/P right knee arthroscopy 12/31/2019   Sleep-disordered breathing 05/23/2017   Polyp of colon 05/23/2017   Abdominal mass 05/23/2017   Past Surgical History:  Procedure Laterality Date   NASAL SEPTUM SURGERY     NASAL SINUS SURGERY      Family History  Problem Relation Age of Onset   Prostate cancer Father        mid 18s   Skin cancer Brother    Parkinson's disease Paternal Grandfather     Medications- reviewed and updated No current outpatient medications on file.   No current facility-administered medications for this visit.    Allergies-reviewed and updated No Known Allergies  Social History   Socioeconomic History   Marital status: Married  Spouse name: Not on file   Number of children: 4   Years of education: Not on file   Highest education level: Not on file  Occupational History   Not on file  Tobacco Use   Smoking status: Never   Smokeless tobacco: Never  Vaping Use   Vaping Use: Never used  Substance and Sexual Activity   Alcohol use: Never   Drug use: Never   Sexual activity: Not on file  Other Topics Concern    Not on file  Social History Narrative   Not on file   Social Determinants of Health   Financial Resource Strain: Not on file  Food Insecurity: Not on file  Transportation Needs: Not on file  Physical Activity: Not on file  Stress: Not on file  Social Connections: Not on file        Objective:  Physical Exam: BP (!) 133/90   Pulse 81   Temp (!) 96.8 F (36 C) (Temporal)   Ht 5\' 11"  (1.803 m)   Wt 188 lb 3.2 oz (85.4 kg)   SpO2 97%   BMI 26.25 kg/m   Body mass index is 26.25 kg/m. Wt Readings from Last 3 Encounters:  06/07/22 188 lb 3.2 oz (85.4 kg)  04/02/21 179 lb 3.2 oz (81.3 kg)  01/22/20 178 lb 4 oz (80.9 kg)   Gen: NAD, resting comfortably HEENT: TMs normal bilaterally. OP clear. No thyromegaly noted.  CV: RRR with no murmurs appreciated Pulm: NWOB, CTAB with no crackles, wheezes, or rhonchi GI: Normal bowel sounds present. Soft, Nontender, Nondistended. MSK: no edema, cyanosis, or clubbing noted Skin: warm, dry Neuro: CN2-12 grossly intact. Strength 5/5 in upper and lower extremities. Reflexes symmetric and intact bilaterally.  Psych: Normal affect and thought content     Jesus Jimenez M. Jimmey Ralph, MD 06/07/2022 9:20 AM

## 2022-06-07 NOTE — Assessment & Plan Note (Signed)
Symptoms are overall stable.  He has seen orthopedics last a couple of years ago.  May need referral again if this continues to be an ongoing issue or if pain worsens.

## 2022-06-08 LAB — HEPATITIS C ANTIBODY: Hepatitis C Ab: NONREACTIVE

## 2022-06-09 NOTE — Progress Notes (Signed)
Great news!  Labs are all stable.  Do not need to make any changes to treatment plan at this time.  He should continue to work on diet and exercise and we can recheck everything in a year or so.

## 2023-07-24 ENCOUNTER — Encounter: Admitting: Family Medicine

## 2023-08-01 ENCOUNTER — Ambulatory Visit: Admitting: Family Medicine

## 2023-08-01 ENCOUNTER — Encounter: Payer: Self-pay | Admitting: Family Medicine

## 2023-08-01 VITALS — BP 134/86 | HR 80 | Temp 97.7°F | Ht 71.0 in | Wt 195.6 lb

## 2023-08-01 DIAGNOSIS — Z8042 Family history of malignant neoplasm of prostate: Secondary | ICD-10-CM

## 2023-08-01 DIAGNOSIS — G4730 Sleep apnea, unspecified: Secondary | ICD-10-CM | POA: Diagnosis not present

## 2023-08-01 DIAGNOSIS — Z125 Encounter for screening for malignant neoplasm of prostate: Secondary | ICD-10-CM

## 2023-08-01 DIAGNOSIS — R0789 Other chest pain: Secondary | ICD-10-CM

## 2023-08-01 DIAGNOSIS — Z23 Encounter for immunization: Secondary | ICD-10-CM | POA: Diagnosis not present

## 2023-08-01 DIAGNOSIS — Z13220 Encounter for screening for lipoid disorders: Secondary | ICD-10-CM

## 2023-08-01 DIAGNOSIS — R252 Cramp and spasm: Secondary | ICD-10-CM | POA: Diagnosis not present

## 2023-08-01 DIAGNOSIS — Z131 Encounter for screening for diabetes mellitus: Secondary | ICD-10-CM

## 2023-08-01 DIAGNOSIS — Z0001 Encounter for general adult medical examination with abnormal findings: Secondary | ICD-10-CM | POA: Diagnosis not present

## 2023-08-01 LAB — COMPREHENSIVE METABOLIC PANEL WITH GFR
ALT: 72 U/L — ABNORMAL HIGH (ref 0–53)
AST: 31 U/L (ref 0–37)
Albumin: 4.5 g/dL (ref 3.5–5.2)
Alkaline Phosphatase: 47 U/L (ref 39–117)
BUN: 13 mg/dL (ref 6–23)
CO2: 29 meq/L (ref 19–32)
Calcium: 9.1 mg/dL (ref 8.4–10.5)
Chloride: 105 meq/L (ref 96–112)
Creatinine, Ser: 0.92 mg/dL (ref 0.40–1.50)
GFR: 95.8 mL/min (ref >60.00)
Glucose, Bld: 90 mg/dL (ref 70–99)
Potassium: 4.7 meq/L (ref 3.5–5.1)
Sodium: 141 meq/L (ref 135–145)
Total Bilirubin: 0.8 mg/dL (ref 0.2–1.2)
Total Protein: 7.2 g/dL (ref 6.0–8.3)

## 2023-08-01 LAB — CBC
HCT: 47.9 % (ref 39.0–52.0)
Hemoglobin: 15.9 g/dL (ref 13.0–17.0)
MCHC: 33.1 g/dL (ref 30.0–36.0)
MCV: 93.2 fl (ref 78.0–100.0)
Platelets: 273 K/uL (ref 150.0–400.0)
RBC: 5.14 Mil/uL (ref 4.22–5.81)
RDW: 13.6 % (ref 11.5–15.5)
WBC: 8.9 K/uL (ref 4.0–10.5)

## 2023-08-01 LAB — LIPID PANEL
Cholesterol: 161 mg/dL (ref 0–200)
HDL: 42.9 mg/dL (ref >39.00)
LDL Cholesterol: 101 mg/dL — ABNORMAL HIGH (ref 0–99)
NonHDL: 117.88
Total CHOL/HDL Ratio: 4
Triglycerides: 86 mg/dL (ref 0.0–149.0)
VLDL: 17.2 mg/dL (ref 0.0–40.0)

## 2023-08-01 LAB — HEMOGLOBIN A1C: Hgb A1c MFr Bld: 5.5 % (ref 4.6–6.5)

## 2023-08-01 LAB — TSH: TSH: 2.4 u[IU]/mL (ref 0.35–5.50)

## 2023-08-01 LAB — PSA: PSA: 3.82 ng/mL (ref 0.10–4.00)

## 2023-08-01 LAB — MAGNESIUM: Magnesium: 1.9 mg/dL (ref 1.5–2.5)

## 2023-08-01 NOTE — Assessment & Plan Note (Signed)
 Patient with worsening swelling over the last several months.  Sometimes wakes up and does not feel refreshed after sleeping all night.  Will place referral for sleep study.

## 2023-08-01 NOTE — Assessment & Plan Note (Signed)
 Check PSA. ?

## 2023-08-01 NOTE — Progress Notes (Signed)
 Chief Complaint:  Jesus Jimenez is a 53 y.o. male who presents today for his annual comprehensive physical exam.    Assessment/Plan:  New/Acute Problems: Hand Cramping  No red flags.  Overall reassuring exam.  Will check labs today including CBC, magnesium, c-Met, TSH, and A1c.  Recommended daily stretching exercises and also recommended B complex.  He will let us  know if not improving and would consider referral or imaging at that time.  Atypical chest pain History atypical for cardiac etiology.  Symptoms are nonexertional and likely musculoskeletal.  Not currently having any symptoms and he has no abnormalities on exam today.  Wells score 0 - doubt PE. We are checking cardiac CT screen for screening purposes for CAD as below.  Depending on above workup may consider referral to physical therapy or sports medicine if symptoms persist. We discussed reasons to return to care and seek emergent care.   Chronic Problems Addressed Today: Sleep-disordered breathing Patient with worsening swelling over the last several months.  Sometimes wakes up and does not feel refreshed after sleeping all night.  Will place referral for sleep study.  Family history of prostate cancer Check PSA.   Preventative Healthcare: Check labs.  Due for colonoscopy next year.  Check cardiac CT scan to screen for CAD. Shingles vaccine given today.   Patient Counseling(The following topics were reviewed and/or handout was given):  -Nutrition: Stressed importance of moderation in sodium/caffeine intake, saturated fat and cholesterol, caloric balance, sufficient intake of fresh fruits, vegetables, and fiber.  -Stressed the importance of regular exercise.   -Substance Abuse: Discussed cessation/primary prevention of tobacco, alcohol, or other drug use; driving or other dangerous activities under the influence; availability of treatment for abuse.   -Injury prevention: Discussed safety belts, safety helmets, smoke detector,  smoking near bedding or upholstery.   -Sexuality: Discussed sexually transmitted diseases, partner selection, use of condoms, avoidance of unintended pregnancy and contraceptive alternatives.   -Dental health: Discussed importance of regular tooth brushing, flossing, and dental visits.  -Health maintenance and immunizations reviewed. Please refer to Health maintenance section.  Return to care in 1 year for next preventative visit.     Subjective:  HPI:  He is here today for his annual physical. See assessment / plan for status of chronic conditions.   He is occasionally getting cramping in his right hand over the last year. Happens randomly and lasts for about 5 minutes.  No obvious precipitating events.  No treatments tried.  Occasionally gets a sharp pain in the left side of his chest.  This also happens randomly.  Does not feel like reflux.      08/01/2023    7:52 AM  Depression screen PHQ 2/9  Decreased Interest 0  Down, Depressed, Hopeless 0  PHQ - 2 Score 0    Health Maintenance Due  Topic Date Due   Zoster Vaccines- Shingrix  (2 of 2) 08/02/2022     ROS: Per HPI, otherwise a complete review of systems was negative.   PMH:  The following were reviewed and entered/updated in epic: Past Medical History:  Diagnosis Date   Colon polyps    Patient Active Problem List   Diagnosis Date Noted   Family history of prostate cancer 08/01/2023   Right knee pain 06/07/2022   S/P right knee arthroscopy 12/31/2019   Sleep-disordered breathing 05/23/2017   Polyp of colon 05/23/2017   Past Surgical History:  Procedure Laterality Date   NASAL SEPTUM SURGERY     NASAL SINUS SURGERY  Family History  Problem Relation Age of Onset   Prostate cancer Father        mid 49s   Skin cancer Brother    Parkinson's disease Paternal Grandfather     Medications- reviewed and updated No current outpatient medications on file.   No current facility-administered medications for this  visit.    Allergies-reviewed and updated No Known Allergies  Social History   Socioeconomic History   Marital status: Married    Spouse name: Not on file   Number of children: 4   Years of education: Not on file   Highest education level: Not on file  Occupational History   Not on file  Tobacco Use   Smoking status: Never   Smokeless tobacco: Never  Vaping Use   Vaping status: Never Used  Substance and Sexual Activity   Alcohol use: Never   Drug use: Never   Sexual activity: Not on file  Other Topics Concern   Not on file  Social History Narrative   Not on file   Social Drivers of Health   Financial Resource Strain: Not on file  Food Insecurity: Not on file  Transportation Needs: Not on file  Physical Activity: Not on file  Stress: Not on file  Social Connections: Not on file        Objective:  Physical Exam: BP 134/86   Pulse 80   Temp 97.7 F (36.5 C) (Temporal)   Ht 5' 11 (1.803 m)   Wt 195 lb 9.6 oz (88.7 kg)   SpO2 96%   BMI 27.28 kg/m   Body mass index is 27.28 kg/m. Wt Readings from Last 3 Encounters:  08/01/23 195 lb 9.6 oz (88.7 kg)  06/07/22 188 lb 3.2 oz (85.4 kg)  04/02/21 179 lb 3.2 oz (81.3 kg)   Gen: NAD, resting comfortably HEENT: TMs normal bilaterally. OP clear. No thyromegaly noted.  CV: RRR with no murmurs appreciated Pulm: NWOB, CTAB with no crackles, wheezes, or rhonchi GI: Normal bowel sounds present. Soft, Nontender, Nondistended. MSK: no edema, cyanosis, or clubbing noted Skin: warm, dry Neuro: CN2-12 grossly intact. Strength 5/5 in upper and lower extremities. Reflexes symmetric and intact bilaterally.  Psych: Normal affect and thought content     Janeshia Ciliberto M. Kennyth, MD 08/01/2023 8:26 AM

## 2023-08-01 NOTE — Patient Instructions (Signed)
 It was very nice to see you today!  We will check blood work today.  I will refer you for a sleep study.  Please try taking a B complex supplement and working on hand stretches.  Let me know if the hand cramping does not improve.  Will get you set up for a cardiac CT scan as well.  Will see back in year for your next physical.  Come back sooner if needed.  Return in about 1 year (around 07/31/2024) for Annual Physical.   Take care, Dr Kennyth  PLEASE NOTE:  If you had any lab tests, please let us  know if you have not heard back within a few days. You may see your results on mychart before we have a chance to review them but we will give you a call once they are reviewed by us .   If we ordered any referrals today, please let us  know if you have not heard from their office within the next week.   If you had any urgent prescriptions sent in today, please check with the pharmacy within an hour of our visit to make sure the prescription was transmitted appropriately.   Please try these tips to maintain a healthy lifestyle:  Eat at least 3 REAL meals and 1-2 snacks per day.  Aim for no more than 5 hours between eating.  If you eat breakfast, please do so within one hour of getting up.   Each meal should contain half fruits/vegetables, one quarter protein, and one quarter carbs (no bigger than a computer mouse)  Cut down on sweet beverages. This includes juice, soda, and sweet tea.   Drink at least 1 glass of water with each meal and aim for at least 8 glasses per day  Exercise at least 150 minutes every week.    Preventive Care 55-11 Years Old, Male Preventive care refers to lifestyle choices and visits with your health care provider that can promote health and wellness. Preventive care visits are also called wellness exams. What can I expect for my preventive care visit? Counseling During your preventive care visit, your health care provider may ask about your: Medical history,  including: Past medical problems. Family medical history. Current health, including: Emotional well-being. Home life and relationship well-being. Sexual activity. Lifestyle, including: Alcohol, nicotine or tobacco, and drug use. Access to firearms. Diet, exercise, and sleep habits. Safety issues such as seatbelt and bike helmet use. Sunscreen use. Work and work Astronomer. Physical exam Your health care provider will check your: Height and weight. These may be used to calculate your BMI (body mass index). BMI is a measurement that tells if you are at a healthy weight. Waist circumference. This measures the distance around your waistline. This measurement also tells if you are at a healthy weight and may help predict your risk of certain diseases, such as type 2 diabetes and high blood pressure. Heart rate and blood pressure. Body temperature. Skin for abnormal spots. What immunizations do I need?  Vaccines are usually given at various ages, according to a schedule. Your health care provider will recommend vaccines for you based on your age, medical history, and lifestyle or other factors, such as travel or where you work. What tests do I need? Screening Your health care provider may recommend screening tests for certain conditions. This may include: Lipid and cholesterol levels. Diabetes screening. This is done by checking your blood sugar (glucose) after you have not eaten for a while (fasting). Hepatitis B test. Hepatitis  C test. HIV (human immunodeficiency virus) test. STI (sexually transmitted infection) testing, if you are at risk. Lung cancer screening. Prostate cancer screening. Colorectal cancer screening. Talk with your health care provider about your test results, treatment options, and if necessary, the need for more tests. Follow these instructions at home: Eating and drinking  Eat a diet that includes fresh fruits and vegetables, whole grains, lean protein, and  low-fat dairy products. Take vitamin and mineral supplements as recommended by your health care provider. Do not drink alcohol if your health care provider tells you not to drink. If you drink alcohol: Limit how much you have to 0-2 drinks a day. Know how much alcohol is in your drink. In the [place] one drink equals one 12 oz bottle of beer (355 mL), one 5 oz glass of wine (148 mL), or one 1 oz glass of hard liquor (44 mL). Lifestyle Brush your teeth every morning and night with fluoride toothpaste. Floss one time each day. Exercise for at least 30 minutes 5 or more days each week. Do not use any products that contain nicotine or tobacco. These products include cigarettes, chewing tobacco, and vaping devices, such as e-cigarettes. If you need help quitting, ask your health care provider. Do not use drugs. If you are sexually active, practice safe sex. Use a condom or other form of protection to prevent STIs. Take aspirin only as told by your health care provider. Make sure that you understand how much to take and what form to take. Work with your health care provider to find out whether it is safe and beneficial for you to take aspirin daily. Find healthy ways to manage stress, such as: Meditation, yoga, or listening to music. Journaling. Talking to a trusted person. Spending time with friends and family. Minimize exposure to UV radiation to reduce your risk of skin cancer. Safety Always wear your seat belt while driving or riding in a vehicle. Do not drive: If you have been drinking alcohol. Do not ride with someone who has been drinking. When you are tired or distracted. While texting. If you have been using any mind-altering substances or drugs. Wear a helmet and other protective equipment during sports activities. If you have firearms in your house, make sure you follow all gun safety procedures. What's next? Go to your health care provider once a year for an annual wellness  visit. Ask your health care provider how often you should have your eyes and teeth checked. Stay up to date on all vaccines. This information is not intended to replace advice given to you by your health care provider. Make sure you discuss any questions you have with your health care provider. Document Revised: 07/08/2020 Document Reviewed: 07/08/2020 Elsevier Patient Education  2024 [employer].

## 2023-08-01 NOTE — Addendum Note (Signed)
 Addended by: IDA ELORA HERO on: 08/01/2023 08:37 AM   Modules accepted: Orders

## 2023-08-04 ENCOUNTER — Ambulatory Visit: Payer: Self-pay | Admitting: Family Medicine

## 2023-08-04 DIAGNOSIS — R748 Abnormal levels of other serum enzymes: Secondary | ICD-10-CM

## 2023-08-04 NOTE — Progress Notes (Signed)
 One of his liver numbers was mildly elevated.  This is probably nothing to worry about however I would like for him to come back in 1 to 2 weeks to recheck.  Please place future order for CMET.  His bad cholesterol is mildly elevated.  Do not need to start meds for this however he should continue to work on diet and exercise and we can recheck this in a year or so.  All his other labs are at goal and we can recheck next year.

## 2023-08-15 ENCOUNTER — Other Ambulatory Visit (INDEPENDENT_AMBULATORY_CARE_PROVIDER_SITE_OTHER)

## 2023-08-15 DIAGNOSIS — R748 Abnormal levels of other serum enzymes: Secondary | ICD-10-CM

## 2023-08-15 LAB — COMPREHENSIVE METABOLIC PANEL WITH GFR
ALT: 57 U/L — ABNORMAL HIGH (ref 0–53)
AST: 27 U/L (ref 0–37)
Albumin: 4.2 g/dL (ref 3.5–5.2)
Alkaline Phosphatase: 51 U/L (ref 39–117)
BUN: 10 mg/dL (ref 6–23)
CO2: 23 meq/L (ref 19–32)
Calcium: 9 mg/dL (ref 8.4–10.5)
Chloride: 108 meq/L (ref 96–112)
Creatinine, Ser: 0.97 mg/dL (ref 0.40–1.50)
GFR: 89.88 mL/min
Glucose, Bld: 89 mg/dL (ref 70–99)
Potassium: 4 meq/L (ref 3.5–5.1)
Sodium: 142 meq/L (ref 135–145)
Total Bilirubin: 0.5 mg/dL (ref 0.2–1.2)
Total Protein: 7.2 g/dL (ref 6.0–8.3)

## 2023-08-17 ENCOUNTER — Ambulatory Visit: Payer: Self-pay | Admitting: Family Medicine

## 2023-08-17 NOTE — Progress Notes (Signed)
 Liver numbers are improving and almost back to normal.  Do not need to do any other testing at this time.  We can recheck next time he comes in for an office visit.

## 2024-08-02 ENCOUNTER — Encounter: Admitting: Family Medicine
